# Patient Record
Sex: Female | Born: 1950 | Race: White | Hispanic: No | Marital: Married | Smoking: Never smoker
Health system: Southern US, Community
[De-identification: ages and names within clinical notes are randomized; demographics above are authoritative.]

## PROBLEM LIST (undated history)

## (undated) DIAGNOSIS — J45909 Unspecified asthma, uncomplicated: Secondary | ICD-10-CM

## (undated) DIAGNOSIS — J302 Other seasonal allergic rhinitis: Secondary | ICD-10-CM

## (undated) DIAGNOSIS — T7840XA Allergy, unspecified, initial encounter: Secondary | ICD-10-CM

## (undated) HISTORY — DX: Unspecified asthma, uncomplicated: J45.909

## (undated) HISTORY — DX: Other seasonal allergic rhinitis: J30.2

## (undated) HISTORY — DX: Allergy, unspecified, initial encounter: T78.40XA

---

## 1997-07-15 ENCOUNTER — Emergency Department (HOSPITAL_COMMUNITY): Admission: RE | Admit: 1997-07-15 | Discharge: 1997-07-15 | Payer: Self-pay | Admitting: Emergency Medicine

## 1998-08-03 ENCOUNTER — Other Ambulatory Visit: Admission: RE | Admit: 1998-08-03 | Discharge: 1998-08-03 | Payer: Self-pay | Admitting: *Deleted

## 1998-10-16 ENCOUNTER — Ambulatory Visit (HOSPITAL_COMMUNITY): Admission: RE | Admit: 1998-10-16 | Discharge: 1998-10-16 | Payer: Self-pay | Admitting: *Deleted

## 1998-10-16 ENCOUNTER — Encounter: Payer: Self-pay | Admitting: *Deleted

## 1999-10-11 ENCOUNTER — Encounter: Admission: RE | Admit: 1999-10-11 | Discharge: 1999-10-11 | Payer: Self-pay | Admitting: *Deleted

## 1999-10-11 ENCOUNTER — Encounter: Payer: Self-pay | Admitting: *Deleted

## 1999-10-11 ENCOUNTER — Other Ambulatory Visit: Admission: RE | Admit: 1999-10-11 | Discharge: 1999-10-11 | Payer: Self-pay | Admitting: *Deleted

## 2000-03-20 ENCOUNTER — Ambulatory Visit (HOSPITAL_COMMUNITY): Admission: RE | Admit: 2000-03-20 | Discharge: 2000-03-20 | Payer: Self-pay | Admitting: *Deleted

## 2000-03-20 ENCOUNTER — Encounter: Payer: Self-pay | Admitting: *Deleted

## 2001-06-18 ENCOUNTER — Ambulatory Visit (HOSPITAL_COMMUNITY): Admission: RE | Admit: 2001-06-18 | Discharge: 2001-06-18 | Payer: Self-pay | Admitting: *Deleted

## 2001-06-18 ENCOUNTER — Encounter: Payer: Self-pay | Admitting: *Deleted

## 2002-06-03 ENCOUNTER — Encounter: Payer: Self-pay | Admitting: Orthopedic Surgery

## 2002-06-03 ENCOUNTER — Encounter: Admission: RE | Admit: 2002-06-03 | Discharge: 2002-06-03 | Payer: Self-pay | Admitting: Orthopedic Surgery

## 2002-07-22 ENCOUNTER — Encounter: Payer: Self-pay | Admitting: Obstetrics and Gynecology

## 2002-07-22 ENCOUNTER — Ambulatory Visit (HOSPITAL_COMMUNITY): Admission: RE | Admit: 2002-07-22 | Discharge: 2002-07-22 | Payer: Self-pay | Admitting: Obstetrics and Gynecology

## 2002-09-02 ENCOUNTER — Other Ambulatory Visit: Admission: RE | Admit: 2002-09-02 | Discharge: 2002-09-02 | Payer: Self-pay | Admitting: Obstetrics and Gynecology

## 2003-10-20 ENCOUNTER — Ambulatory Visit (HOSPITAL_COMMUNITY): Admission: RE | Admit: 2003-10-20 | Discharge: 2003-10-20 | Payer: Self-pay | Admitting: Obstetrics and Gynecology

## 2003-11-22 ENCOUNTER — Other Ambulatory Visit: Admission: RE | Admit: 2003-11-22 | Discharge: 2003-11-22 | Payer: Self-pay | Admitting: Obstetrics and Gynecology

## 2004-10-18 ENCOUNTER — Other Ambulatory Visit: Admission: RE | Admit: 2004-10-18 | Discharge: 2004-10-18 | Payer: Self-pay | Admitting: Obstetrics and Gynecology

## 2004-10-25 ENCOUNTER — Ambulatory Visit (HOSPITAL_COMMUNITY): Admission: RE | Admit: 2004-10-25 | Discharge: 2004-10-25 | Payer: Self-pay | Admitting: Obstetrics and Gynecology

## 2005-11-14 ENCOUNTER — Other Ambulatory Visit: Admission: RE | Admit: 2005-11-14 | Discharge: 2005-11-14 | Payer: Self-pay | Admitting: Obstetrics and Gynecology

## 2005-11-14 ENCOUNTER — Ambulatory Visit (HOSPITAL_COMMUNITY): Admission: RE | Admit: 2005-11-14 | Discharge: 2005-11-14 | Payer: Self-pay | Admitting: Obstetrics and Gynecology

## 2006-11-27 ENCOUNTER — Ambulatory Visit (HOSPITAL_COMMUNITY): Admission: RE | Admit: 2006-11-27 | Discharge: 2006-11-27 | Payer: Self-pay | Admitting: Obstetrics and Gynecology

## 2007-09-03 ENCOUNTER — Ambulatory Visit: Payer: Self-pay | Admitting: Internal Medicine

## 2007-10-09 ENCOUNTER — Encounter: Admission: RE | Admit: 2007-10-09 | Discharge: 2007-10-09 | Payer: Self-pay | Admitting: Neurosurgery

## 2007-12-24 ENCOUNTER — Ambulatory Visit (HOSPITAL_COMMUNITY): Admission: RE | Admit: 2007-12-24 | Discharge: 2007-12-24 | Payer: Self-pay | Admitting: Obstetrics and Gynecology

## 2008-04-20 ENCOUNTER — Telehealth: Payer: Self-pay | Admitting: Internal Medicine

## 2008-05-17 ENCOUNTER — Telehealth: Payer: Self-pay | Admitting: Internal Medicine

## 2008-05-25 IMAGING — MG MM DIGITAL SCREENING BILAT
4 series · 4 of 4 positions shown · non-contrast
Comparison: none

DG SCREEN MAMMOGRAM BILATERAL
Bilateral CC and MLO view(s) were taken.

SCREENING MAMMOGRAM:
There is a  dense fibroglandular pattern.  No masses or malignant type calcifications are 
identified.  Compared with prior studies.

[R CC]
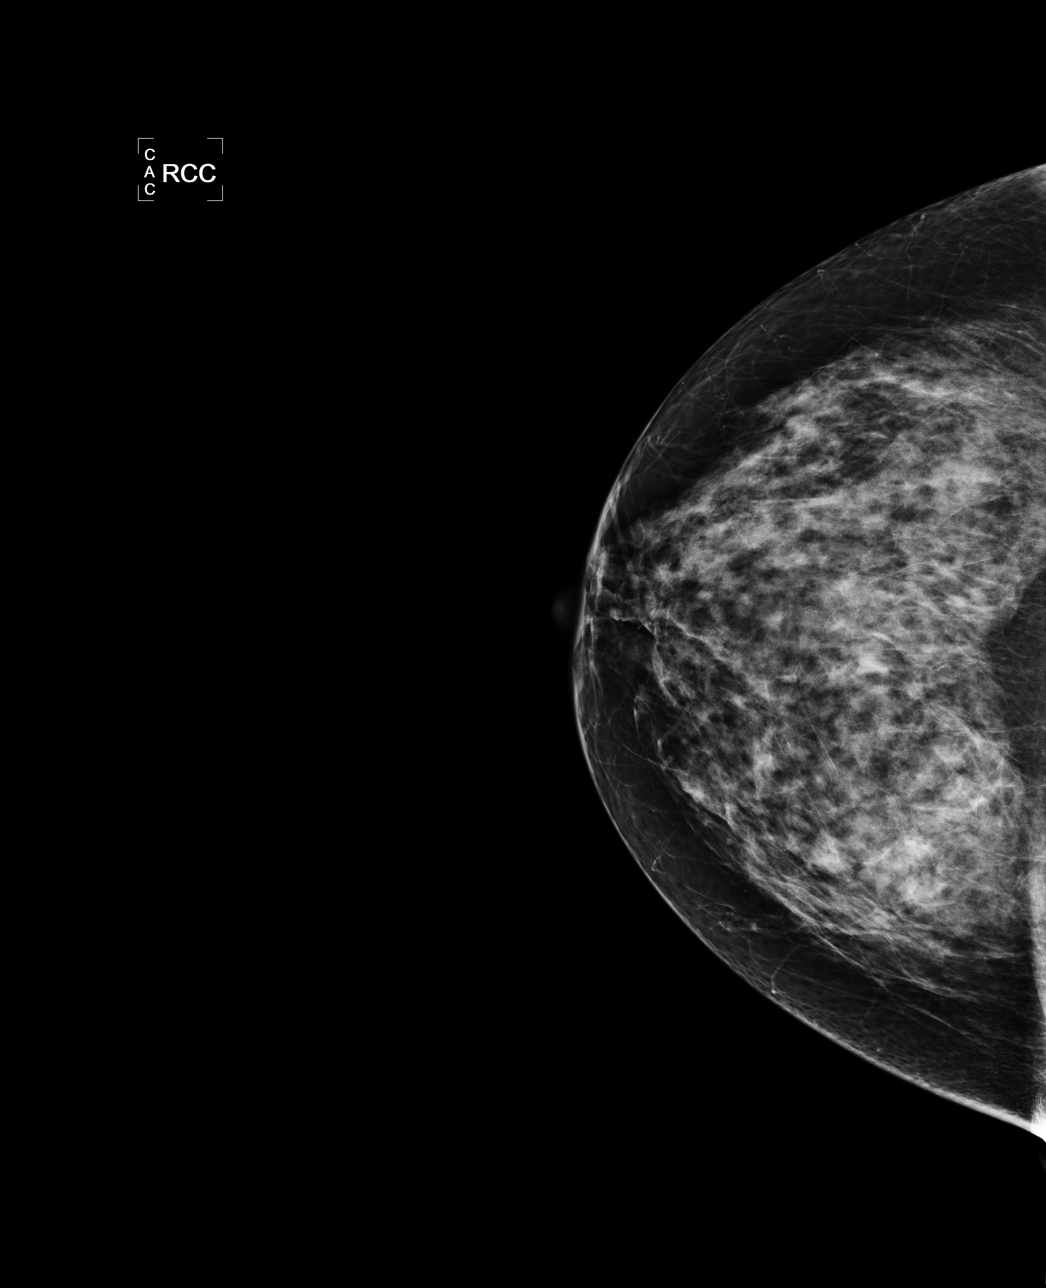

[R MLO]
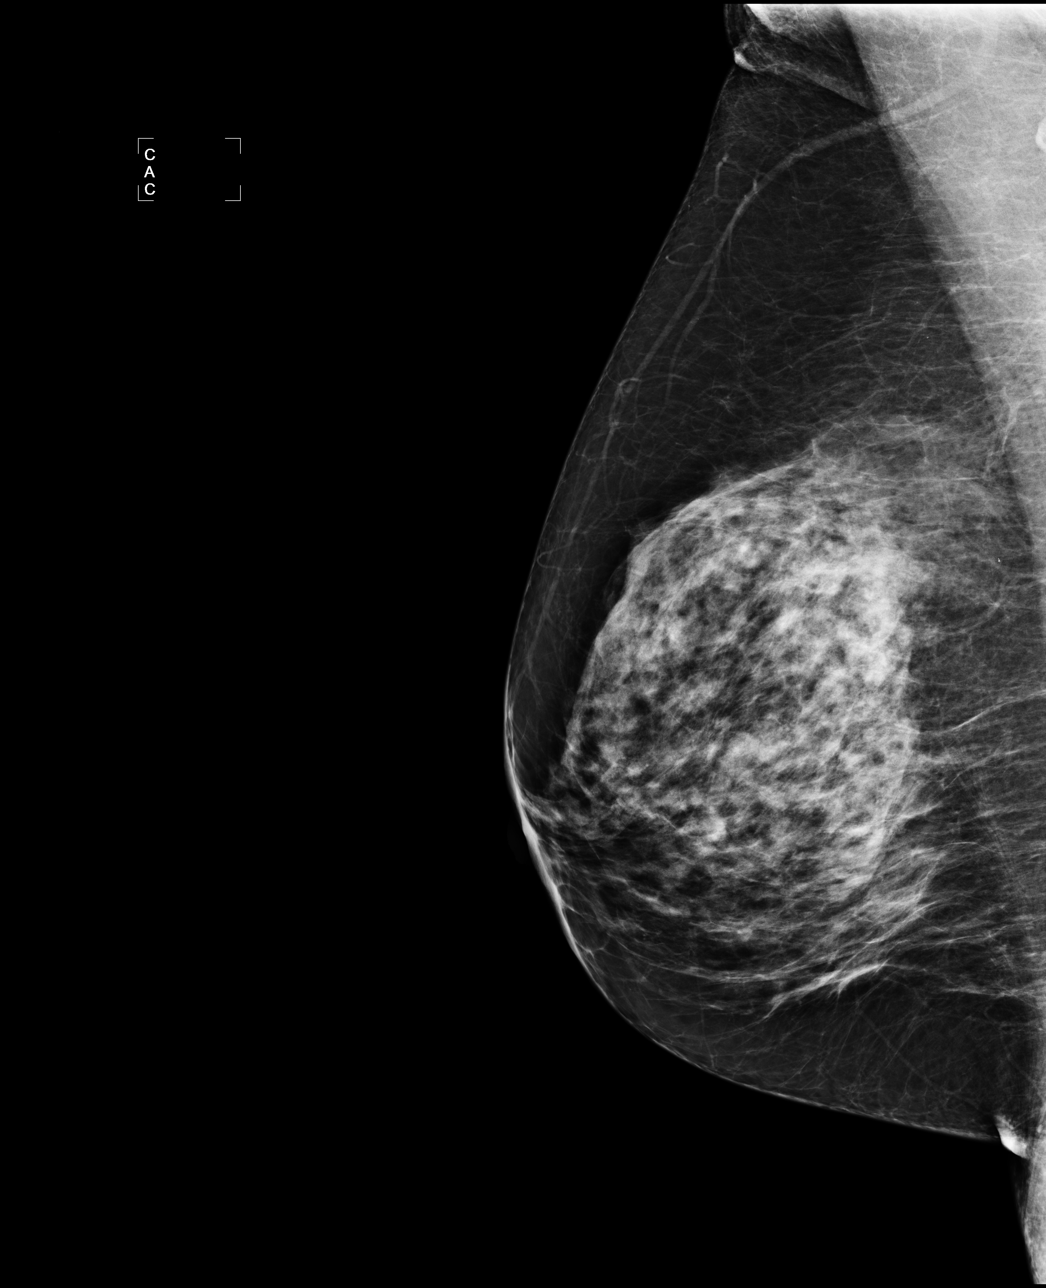

[L CC]
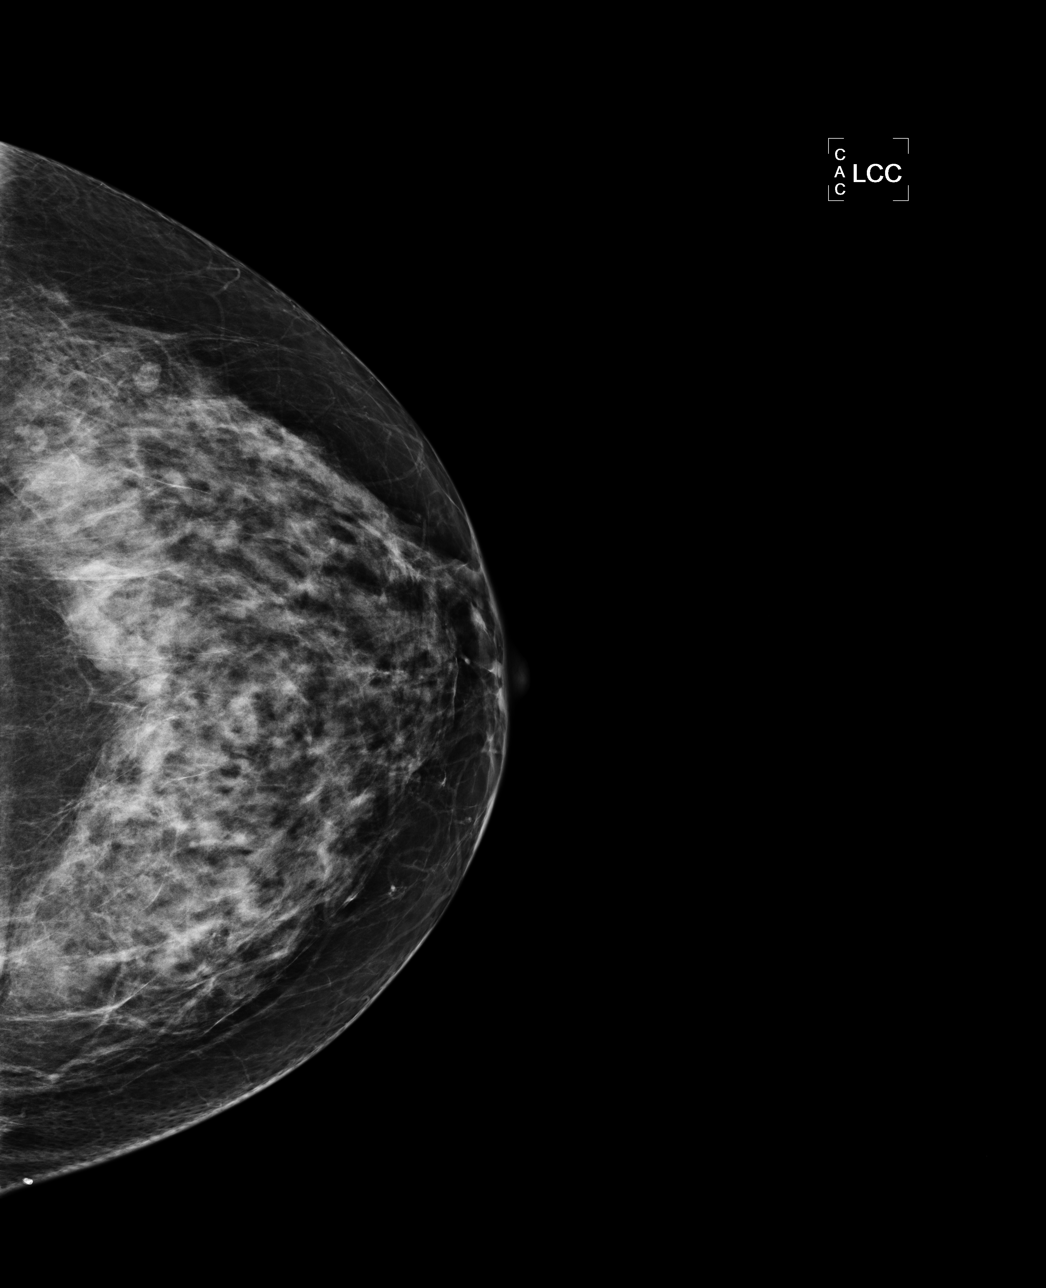

[L MLO]
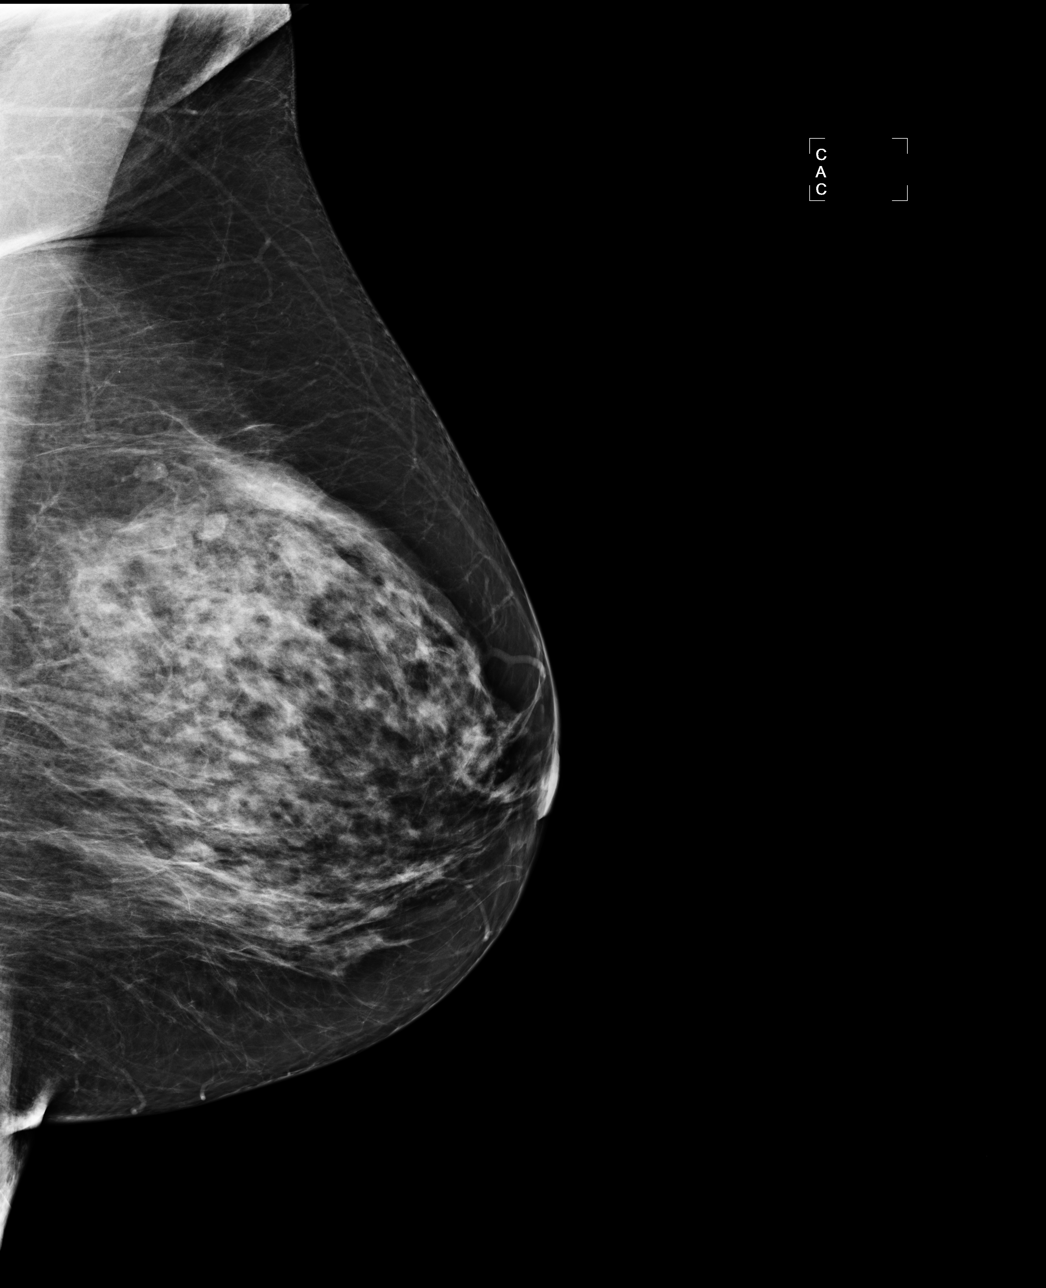

[4 of 4 positions shown; findings below may reference images not displayed]

IMPRESSION: No specific mammographic evidence of malignancy.  Next screening mammogram is recommended in one 
year.

ASSESSMENT: Negative - BI-RADS 1

Screening mammogram in 1 year.
ANALYZED BY COMPUTER AIDED DETECTION. , THIS PROCEDURE WAS A DIGITAL MAMMOGRAM.

## 2008-05-26 ENCOUNTER — Emergency Department (HOSPITAL_COMMUNITY): Admission: EM | Admit: 2008-05-26 | Discharge: 2008-05-26 | Payer: Self-pay | Admitting: Family Medicine

## 2008-08-28 DIAGNOSIS — M949 Disorder of cartilage, unspecified: Secondary | ICD-10-CM

## 2008-08-28 DIAGNOSIS — M899 Disorder of bone, unspecified: Secondary | ICD-10-CM | POA: Insufficient documentation

## 2008-08-29 ENCOUNTER — Ambulatory Visit: Payer: Self-pay | Admitting: Internal Medicine

## 2008-10-27 ENCOUNTER — Ambulatory Visit: Payer: Self-pay | Admitting: Internal Medicine

## 2008-10-27 ENCOUNTER — Encounter: Payer: Self-pay | Admitting: Internal Medicine

## 2008-10-30 ENCOUNTER — Encounter: Payer: Self-pay | Admitting: Internal Medicine

## 2009-01-12 ENCOUNTER — Encounter: Admission: RE | Admit: 2009-01-12 | Discharge: 2009-01-12 | Payer: Self-pay | Admitting: Obstetrics and Gynecology

## 2010-01-25 ENCOUNTER — Encounter: Admission: RE | Admit: 2010-01-25 | Discharge: 2010-01-25 | Payer: Self-pay | Admitting: Obstetrics and Gynecology

## 2010-06-09 ENCOUNTER — Encounter: Payer: Self-pay | Admitting: Obstetrics and Gynecology

## 2011-03-05 ENCOUNTER — Other Ambulatory Visit: Payer: Self-pay | Admitting: Obstetrics and Gynecology

## 2011-03-05 DIAGNOSIS — Z1231 Encounter for screening mammogram for malignant neoplasm of breast: Secondary | ICD-10-CM

## 2011-03-14 ENCOUNTER — Ambulatory Visit
Admission: RE | Admit: 2011-03-14 | Discharge: 2011-03-14 | Disposition: A | Discharge status: Home / Self Care | Payer: BC Managed Care – PPO | Source: Ambulatory Visit | Attending: Obstetrics and Gynecology | Admitting: Obstetrics and Gynecology

## 2011-03-14 DIAGNOSIS — Z1231 Encounter for screening mammogram for malignant neoplasm of breast: Secondary | ICD-10-CM

## 2012-02-27 ENCOUNTER — Other Ambulatory Visit: Payer: Self-pay | Admitting: Obstetrics and Gynecology

## 2012-02-27 DIAGNOSIS — Z1231 Encounter for screening mammogram for malignant neoplasm of breast: Secondary | ICD-10-CM

## 2012-04-02 ENCOUNTER — Ambulatory Visit
Admission: RE | Admit: 2012-04-02 | Discharge: 2012-04-02 | Disposition: A | Discharge status: Home / Self Care | Payer: BC Managed Care – PPO | Source: Ambulatory Visit | Attending: Obstetrics and Gynecology | Admitting: Obstetrics and Gynecology

## 2012-04-02 DIAGNOSIS — Z1231 Encounter for screening mammogram for malignant neoplasm of breast: Secondary | ICD-10-CM

## 2013-03-11 ENCOUNTER — Other Ambulatory Visit: Payer: Self-pay

## 2013-03-11 DIAGNOSIS — Z1231 Encounter for screening mammogram for malignant neoplasm of breast: Secondary | ICD-10-CM

## 2013-07-01 ENCOUNTER — Ambulatory Visit: Payer: BC Managed Care – PPO

## 2013-09-07 ENCOUNTER — Encounter: Payer: Self-pay | Admitting: General Practice

## 2013-09-07 ENCOUNTER — Encounter: Payer: Self-pay | Admitting: Internal Medicine

## 2014-02-14 ENCOUNTER — Encounter: Payer: Self-pay | Admitting: Internal Medicine

## 2014-08-31 ENCOUNTER — Encounter: Payer: Self-pay | Admitting: Internal Medicine

## 2015-01-29 ENCOUNTER — Emergency Department (HOSPITAL_COMMUNITY)
Admission: EM | Admit: 2015-01-29 | Discharge: 2015-01-29 | Disposition: A | Discharge status: Home / Self Care | Payer: BC Managed Care – PPO | Attending: Emergency Medicine | Admitting: Emergency Medicine

## 2015-01-29 ENCOUNTER — Encounter (HOSPITAL_COMMUNITY): Payer: Self-pay | Admitting: *Deleted

## 2015-01-29 DIAGNOSIS — R42 Dizziness and giddiness: Secondary | ICD-10-CM | POA: Diagnosis not present

## 2015-01-29 MED ORDER — MECLIZINE HCL 25 MG PO TABS
25.0000 mg | ORAL_TABLET | Freq: Once | ORAL | Status: AC
  Administered 2015-01-29: 25 mg via ORAL
  Filled 2015-01-29: qty 1

## 2015-01-29 MED ORDER — LORAZEPAM 0.5 MG PO TABS
0.5000 mg | ORAL_TABLET | Freq: Once | ORAL | Status: AC
  Administered 2015-01-29: 0.5 mg via ORAL
  Filled 2015-01-29: qty 1

## 2015-01-29 MED ORDER — LORAZEPAM 1 MG PO TABS: 0.5000 mg | ORAL_TABLET | Freq: Three times a day (TID) | ORAL | Status: DC | PRN

## 2015-01-29 MED ORDER — MECLIZINE HCL 25 MG PO TABS: 25.0000 mg | ORAL_TABLET | Freq: Three times a day (TID) | ORAL | Status: DC | PRN

## 2015-01-29 NOTE — ED Notes (Signed)
Bed: EC95 Expected date: 01/29/15 Expected time:  Means of arrival: Ambulance Comments: Weakness

## 2015-01-29 NOTE — ED Notes (Signed)
Per the patient she woke up this AM 0430 to ambulate to restroom, experienced dizziness, nausea. No emesis. Experienced 2 episodes of watery diarrhea since.

## 2015-01-29 NOTE — Discharge Instructions (Signed)
Benign Positional Vertigo Vertigo means you feel like you or your surroundings are moving when they are not. Benign positional vertigo is the most common form of vertigo. Benign means that the cause of your condition is not serious. Benign positional vertigo is more common in older adults. CAUSES  Benign positional vertigo is the result of an upset in the labyrinth system. This is an area in the middle ear that helps control your balance. This may be caused by a viral infection, head injury, or repetitive motion. However, often no specific cause is found. SYMPTOMS  Symptoms of benign positional vertigo occur when you move your head or eyes in different directions. Some of the symptoms may include:  Loss of balance and falls.  Vomiting.  Blurred vision.  Dizziness.  Nausea.  Involuntary eye movements (nystagmus). DIAGNOSIS  Benign positional vertigo is usually diagnosed by physical exam. If the specific cause of your benign positional vertigo is unknown, your caregiver may perform imaging tests, such as magnetic resonance imaging (MRI) or computed tomography (CT). TREATMENT  Your caregiver may recommend movements or procedures to correct the benign positional vertigo. Medicines such as meclizine, benzodiazepines, and medicines for nausea may be used to treat your symptoms. In rare cases, if your symptoms are caused by certain conditions that affect the inner ear, you may need surgery. HOME CARE INSTRUCTIONS   Follow your caregiver's instructions.  Move slowly. Do not make sudden body or head movements.  Avoid driving.  Avoid operating heavy machinery.  Avoid performing any tasks that would be dangerous to you or others during a vertigo episode.  Drink enough fluids to keep your urine clear or pale yellow. SEEK IMMEDIATE MEDICAL CARE IF:   You develop problems with walking, weakness, numbness, or using your arms, hands, or legs.  You have difficulty speaking.  You develop  severe headaches.  Your nausea or vomiting continues or gets worse.  You develop visual changes.  Your family or friends notice any behavioral changes.  Your condition gets worse.  You have a fever.  You develop a stiff neck or sensitivity to light. MAKE SURE YOU:   Understand these instructions.  Will watch your condition.  Will get help right away if you are not doing well or get worse. Document Released: 02/10/2006 Document Revised: 07/28/2011 Document Reviewed: 01/23/2011 [facility] Patient Information 2015 [place], [place]. This information is not intended to replace advice given to you by your health care provider. Make sure you discuss any questions you have with your health care provider.   Medication for dizziness and relaxation. Rest. Follow-up your primary care doctor.

## 2015-01-29 NOTE — ED Provider Notes (Signed)
CSN: [id number]     Arrival date & time 01/29/15  0911 History   First MD Initiated Contact with Patient 01/29/15 [phone]     Chief Complaint  Patient presents with  . Dizziness    With nausea, diarrhea     (Consider location/radiation/quality/duration/timing/severity/associated sxs/prior Treatment) HPI.... Dizziness since 0430 today described as room spinning. This is never happened before. She was able to ambulate. No gross neurological deficits. No facial drooping. No confusion. No stiff neck. She is normally healthy. Takes no medications. No alcohol or drugs. She took nothing for her symptoms at home  No past medical history on file. No past surgical history on file. Family History  Problem Relation Age of Onset  . Diabetes Father   . Stroke Father    Social History  Substance Use Topics  . Smoking status: Never Smoker   . Smokeless tobacco: Not on file  . Alcohol Use: No   OB History    No data available     Review of Systems  All other systems reviewed and are negative.     Allergies  Review of patient's allergies indicates not on file.  Home Medications   Prior to Admission medications   Medication Sig Start Date End Date Taking? Authorizing Provider  LORazepam (ATIVAN) 1 MG tablet Take 0.5 tablets (0.5 mg total) by mouth 3 (three) times daily as needed for anxiety. 01/29/15   Nat Christen, MD  meclizine (ANTIVERT) 25 MG tablet Take 1 tablet (25 mg total) by mouth 3 (three) times daily as needed for dizziness or nausea. 01/29/15   Nat Christen, MD   BP 117/56 mmHg  Pulse 65  Temp(Src) 98.2 F (36.8 C) (Oral)  Resp 16  Ht 5\' 6"  (1.676 m)  Wt 132 lb (59.875 kg)  BMI 21.32 kg/m2  SpO2 98% Physical Exam  Constitutional: She is oriented to person, place, and time. She appears well-developed and well-nourished.  HENT:  Head: Normocephalic and atraumatic.  Eyes: Conjunctivae and EOM are normal. Pupils are equal, round, and reactive to light.  Neck: Normal range of  motion. Neck supple.  Cardiovascular: Normal rate and regular rhythm.   Pulmonary/Chest: Effort normal and breath sounds normal.  Abdominal: Soft. Bowel sounds are normal.  Musculoskeletal: Normal range of motion.  Neurological: She is alert and oriented to person, place, and time.  Slight horizontal nystagmus  Skin: Skin is warm and dry.  Psychiatric: She has a normal mood and affect. Her behavior is normal.  Nursing note and vitals reviewed.   ED Course  Procedures (including critical care time) Labs Review Labs Reviewed - No data to display  Imaging Review No results found. I have personally reviewed and evaluated these images and lab results as part of my medical decision-making.   EKG Interpretation None      MDM   Final diagnoses:  Vertigo    History of physical consistent with benign positional vertigo. No evidence of stroke. Rx Ativan 0.5 mg and Antivert 25 mg. Discussed with patient and her family.    Nat Christen, MD 01/29/15 1014

## 2015-07-03 ENCOUNTER — Encounter: Payer: Self-pay | Admitting: Internal Medicine

## 2015-09-11 ENCOUNTER — Ambulatory Visit (INDEPENDENT_AMBULATORY_CARE_PROVIDER_SITE_OTHER)
Admission: EM | Admit: 2015-09-11 | Discharge: 2015-09-11 | Disposition: A | Discharge status: Home / Self Care | Payer: BC Managed Care – PPO | Source: Home / Self Care | Attending: Emergency Medicine | Admitting: Emergency Medicine

## 2015-09-11 ENCOUNTER — Encounter (HOSPITAL_COMMUNITY): Payer: Self-pay | Admitting: Emergency Medicine

## 2015-09-11 DIAGNOSIS — L02411 Cutaneous abscess of right axilla: Secondary | ICD-10-CM | POA: Insufficient documentation

## 2015-09-11 DIAGNOSIS — L03111 Cellulitis of right axilla: Secondary | ICD-10-CM | POA: Insufficient documentation

## 2015-09-11 MED ORDER — SULFAMETHOXAZOLE-TRIMETHOPRIM 800-160 MG PO TABS: 1.0000 | ORAL_TABLET | Freq: Two times a day (BID) | ORAL | Status: AC

## 2015-09-11 MED ORDER — CEPHALEXIN 500 MG PO CAPS: 500.0000 mg | ORAL_CAPSULE | Freq: Four times a day (QID) | ORAL | Status: DC

## 2015-09-11 MED ORDER — LIDOCAINE HCL 2 % IJ SOLN
INTRAMUSCULAR | Status: AC
  Filled 2015-09-11: qty 20

## 2015-09-11 MED ORDER — LIDOCAINE HCL (PF) 1 % IJ SOLN
INTRAMUSCULAR | Status: AC
  Filled 2015-09-11: qty 5

## 2015-09-11 MED ORDER — CEFTRIAXONE SODIUM 1 G IJ SOLR
1.0000 g | Freq: Once | INTRAMUSCULAR | Status: AC
  Administered 2015-09-11: 1 g via INTRAMUSCULAR

## 2015-09-11 MED ORDER — HYDROCODONE-ACETAMINOPHEN 5-325 MG PO TABS: 1.0000 | ORAL_TABLET | ORAL | Status: DC | PRN

## 2015-09-11 MED ORDER — CEFTRIAXONE SODIUM 1 G IJ SOLR
INTRAMUSCULAR | Status: AC
  Filled 2015-09-11: qty 10

## 2015-09-11 NOTE — ED Provider Notes (Signed)
CSN: [id number]     Arrival date & time 09/11/15  1411 History   First MD Initiated Contact with Patient 09/11/15 1422     Chief Complaint  Patient presents with  . Abscess   (Consider location/radiation/quality/duration/timing/severity/associated sxs/prior Treatment) HPI  She is a 65 year old woman here for evaluation of abscess. She states she noticed it in her right axilla 3 days ago. It has gradually been getting worse and more painful. She does report some spontaneous drainage. She also reports chills and subjective fever. No nausea or vomiting. She has been doing warm compresses. She did have an abscess in the left axilla a year ago.  History reviewed. No pertinent past medical history. History reviewed. No pertinent past surgical history. Family History  Problem Relation Age of Onset  . Diabetes Father   . Stroke Father    Social History  Substance Use Topics  . Smoking status: Never Smoker   . Smokeless tobacco: None  . Alcohol Use: No   OB History    No data available     Review of Systems As in history of present illness Allergies  Review of patient's allergies indicates no known allergies.  Home Medications   Prior to Admission medications   Medication Sig Start Date End Date Taking? Authorizing Provider  fluticasone (FLONASE) 50 MCG/ACT nasal spray Place into both nostrils daily.   Yes Historical Provider, MD  cephALEXin (KEFLEX) 500 MG capsule Take 1 capsule (500 mg total) by mouth 4 (four) times daily. 09/11/15   Melony Overly, MD  HYDROcodone-acetaminophen (NORCO) 5-325 MG tablet Take 1 tablet by mouth every 4 (four) hours as needed for moderate pain. 09/11/15   Melony Overly, MD  LORazepam (ATIVAN) 1 MG tablet Take 0.5 tablets (0.5 mg total) by mouth 3 (three) times daily as needed for anxiety. 01/29/15   Nat Christen, MD  meclizine (ANTIVERT) 25 MG tablet Take 1 tablet (25 mg total) by mouth 3 (three) times daily as needed for dizziness or nausea. 01/29/15   Nat Christen, MD  sulfamethoxazole-trimethoprim (BACTRIM DS,SEPTRA DS) 800-160 MG tablet Take 1 tablet by mouth 2 (two) times daily. 09/11/15 09/18/15  Melony Overly, MD   Meds Ordered and Administered this Visit   Medications  cefTRIAXone (ROCEPHIN) injection 1 g (1 g Intramuscular Given 09/11/15 1512)    BP 112/53 mmHg  Pulse 88  Temp(Src) 98.6 F (37 C) (Oral)  Resp 18  SpO2 100% No data found.   Physical Exam  Constitutional: She is oriented to person, place, and time. She appears well-developed and well-nourished. No distress.  Cardiovascular: Normal rate.   Pulmonary/Chest: Effort normal.  Neurological: She is alert and oriented to person, place, and time.  Skin:  She has a 1 cm abscess in the right axilla that has spontaneously drained. More concerning, she has a large area of erythema and induration inferior to this. This area is quite tender and warm.    ED Course  .Marland KitchenIncision and Drainage Date/Time: 09/11/2015 3:15 PM Performed by: Melony Overly Authorized by: Melony Overly Consent: Verbal consent obtained. Risks and benefits: risks, benefits and alternatives were discussed Consent given by: patient Patient understanding: patient states understanding of the procedure being performed Patient identity confirmed: verbally with patient Time out: Immediately prior to procedure a "time out" was called to verify the correct patient, procedure, equipment, support staff and site/side marked as required. Type: abscess Location: right axilla. Anesthesia: local infiltration Local anesthetic: lidocaine 2% without epinephrine Anesthetic total:  1 ml Scalpel size: 11 Incision type: single straight Incision depth: dermal Complexity: simple Drainage: purulent Drainage amount: moderate Wound treatment: wound left open Patient tolerance: Patient tolerated the procedure well with no immediate complications   (including critical care time)  Labs Review Labs Reviewed  CULTURE,  ROUTINE-ABSCESS    Imaging Review No results found.   MDM   1. Abscess of right axilla   2. Cellulitis of right axilla    Culture sent. Will start on Keflex and Bactrim. Dose of Rocephin given here. Prescription given for hydrocodone to use as needed for pain. Follow-up if not improving in 2 days.    Melony Overly, MD 09/11/15 [phone]

## 2015-09-11 NOTE — ED Notes (Signed)
Reports abscess right axilla.  Noticed this on Saturday.  Also concerned for chills and feeling feverish.  Reports having one other episode involving the left axilla.

## 2015-09-11 NOTE — Discharge Instructions (Signed)
You do have a small abscess in your axilla. Most of your pain is coming from a surrounding cellulitis. Take Keflex 4 times a day for the next week. Take Bactrim twice a day for the next week. Continue ibuprofen every 6 hours as needed for pain. Use the hydrocodone every 4-6 hours as needed for severe pain. This medicine may make you drowsy or groggy. This should start to improve in 24-48 hours. I did send a culture. We will call if we need to adjust your antibiotics. If it is not getting better in 2 days, please come back.

## 2015-09-12 ENCOUNTER — Observation Stay (HOSPITAL_COMMUNITY): Payer: BC Managed Care – PPO | Admitting: Anesthesiology

## 2015-09-12 ENCOUNTER — Inpatient Hospital Stay (HOSPITAL_COMMUNITY)
Admission: AD | Admit: 2015-09-12 | Discharge: 2015-09-15 | DRG: 603 | Disposition: A | Discharge status: Home / Self Care | Payer: BC Managed Care – PPO | Source: Ambulatory Visit | Attending: General Surgery | Admitting: General Surgery

## 2015-09-12 ENCOUNTER — Other Ambulatory Visit (INDEPENDENT_AMBULATORY_CARE_PROVIDER_SITE_OTHER): Payer: Self-pay | Admitting: Physician Assistant

## 2015-09-12 ENCOUNTER — Encounter (HOSPITAL_COMMUNITY): Admission: AD | Disposition: A | Discharge status: Home / Self Care | Payer: Self-pay | Source: Ambulatory Visit

## 2015-09-12 ENCOUNTER — Encounter (HOSPITAL_COMMUNITY): Payer: Self-pay | Admitting: Certified Registered Nurse Anesthetist

## 2015-09-12 DIAGNOSIS — L03111 Cellulitis of right axilla: Secondary | ICD-10-CM | POA: Diagnosis present

## 2015-09-12 DIAGNOSIS — Z79899 Other long term (current) drug therapy: Secondary | ICD-10-CM

## 2015-09-12 DIAGNOSIS — E876 Hypokalemia: Secondary | ICD-10-CM | POA: Diagnosis present

## 2015-09-12 DIAGNOSIS — Z79891 Long term (current) use of opiate analgesic: Secondary | ICD-10-CM

## 2015-09-12 DIAGNOSIS — L02411 Cutaneous abscess of right axilla: Principal | ICD-10-CM | POA: Diagnosis present

## 2015-09-12 HISTORY — PX: INCISION AND DRAINAGE ABSCESS: SHX5864

## 2015-09-12 LAB — COMPREHENSIVE METABOLIC PANEL
ALBUMIN: 3.2 g/dL — AB (ref 3.5–5.0)
ALK PHOS: 83 U/L (ref 38–126)
ALT: 21 U/L (ref 14–54)
AST: 17 U/L (ref 15–41)
Anion gap: 14 (ref 5–15)
BUN: 21 mg/dL — ABNORMAL HIGH (ref 6–20)
CALCIUM: 8.9 mg/dL (ref 8.9–10.3)
CHLORIDE: 99 mmol/L — AB (ref 101–111)
CO2: 24 mmol/L (ref 22–32)
CREATININE: 0.77 mg/dL (ref 0.44–1.00)
GFR calc Af Amer: >60 mL/min (ref >60)
GFR calc non Af Amer: >60 mL/min (ref >60)
GLUCOSE: 114 mg/dL — AB (ref 65–99)
Potassium: 3.8 mmol/L (ref 3.5–5.1)
SODIUM: 137 mmol/L (ref 135–145)
Total Bilirubin: 1 mg/dL (ref 0.3–1.2)
Total Protein: 6.7 g/dL (ref 6.5–8.1)

## 2015-09-12 LAB — CBC WITH DIFFERENTIAL/PLATELET
BASOS PCT: 0 %
Basophils Absolute: 0.1 10*3/uL (ref 0.0–0.1)
EOS ABS: 0.2 10*3/uL (ref 0.0–0.7)
Eosinophils Relative: 1 %
HCT: 38 % (ref 36.0–46.0)
HEMOGLOBIN: 12.6 g/dL (ref 12.0–15.0)
LYMPHS ABS: 1.6 10*3/uL (ref 0.7–4.0)
Lymphocytes Relative: 9 %
MCH: 28.7 pg (ref 26.0–34.0)
MCHC: 33.2 g/dL (ref 30.0–36.0)
MCV: 86.6 fL (ref 78.0–100.0)
Monocytes Absolute: 1.5 10*3/uL — ABNORMAL HIGH (ref 0.1–1.0)
Monocytes Relative: 8 %
NEUTROS PCT: 82 %
Neutro Abs: 14.4 10*3/uL — ABNORMAL HIGH (ref 1.7–7.7)
Platelets: 268 10*3/uL (ref 150–400)
RBC: 4.39 MIL/uL (ref 3.87–5.11)
RDW: 13.5 % (ref 11.5–15.5)
WBC: 17.6 10*3/uL — AB (ref 4.0–10.5)

## 2015-09-12 LAB — SURGICAL PCR SCREEN
MRSA, PCR: NEGATIVE
Staphylococcus aureus: NEGATIVE

## 2015-09-12 SURGERY — INCISION AND DRAINAGE, ABSCESS
Anesthesia: General | Laterality: Right

## 2015-09-12 MED ORDER — HYDROMORPHONE HCL 1 MG/ML IJ SOLN
1.0000 mg | INTRAMUSCULAR | Status: DC | PRN
  Administered 2015-09-12 – 2015-09-13 (×4): 1 mg via INTRAVENOUS
  Filled 2015-09-12 (×4): qty 1

## 2015-09-12 MED ORDER — HYDROMORPHONE HCL 1 MG/ML IJ SOLN: 1.0000 mg | INTRAMUSCULAR | Status: DC | PRN

## 2015-09-12 MED ORDER — VANCOMYCIN HCL IN DEXTROSE 750-5 MG/150ML-% IV SOLN
750.0000 mg | Freq: Two times a day (BID) | INTRAVENOUS | Status: DC
  Administered 2015-09-13 – 2015-09-14 (×3): 750 mg via INTRAVENOUS
  Filled 2015-09-12 (×3): qty 150

## 2015-09-12 MED ORDER — HEPARIN SODIUM (PORCINE) 5000 UNIT/ML IJ SOLN
5000.0000 [IU] | Freq: Three times a day (TID) | INTRAMUSCULAR | Status: DC
Start: 2015-09-12 — End: 2015-09-15
  Administered 2015-09-13 – 2015-09-15 (×7): 5000 [IU] via SUBCUTANEOUS
  Filled 2015-09-12 (×11): qty 1

## 2015-09-12 MED ORDER — FENTANYL CITRATE (PF) 100 MCG/2ML IJ SOLN
INTRAMUSCULAR | Status: DC | PRN
  Administered 2015-09-12 (×2): 50 ug via INTRAVENOUS

## 2015-09-12 MED ORDER — ONDANSETRON HCL 4 MG/2ML IJ SOLN
INTRAMUSCULAR | Status: AC
  Filled 2015-09-12: qty 2

## 2015-09-12 MED ORDER — VANCOMYCIN HCL IN DEXTROSE 1-5 GM/200ML-% IV SOLN
1000.0000 mg | Freq: Once | INTRAVENOUS | Status: AC
  Administered 2015-09-12: 1000 mg via INTRAVENOUS
  Filled 2015-09-12: qty 200

## 2015-09-12 MED ORDER — ONDANSETRON HCL 4 MG/2ML IJ SOLN
INTRAMUSCULAR | Status: DC | PRN
  Administered 2015-09-12: 4 mg via INTRAVENOUS

## 2015-09-12 MED ORDER — FENTANYL CITRATE (PF) 250 MCG/5ML IJ SOLN
INTRAMUSCULAR | Status: AC
  Filled 2015-09-12: qty 5

## 2015-09-12 MED ORDER — PROPOFOL 10 MG/ML IV BOLUS
INTRAVENOUS | Status: AC
  Filled 2015-09-12: qty 20

## 2015-09-12 MED ORDER — PIPERACILLIN-TAZOBACTAM 3.375 G IVPB 30 MIN
3.3750 g | Freq: Once | INTRAVENOUS | Status: AC
  Administered 2015-09-12: 3.375 g via INTRAVENOUS
  Filled 2015-09-12: qty 50

## 2015-09-12 MED ORDER — ONDANSETRON HCL 4 MG/2ML IJ SOLN: 4.0000 mg | Freq: Once | INTRAMUSCULAR | Status: DC | PRN

## 2015-09-12 MED ORDER — HYDROCODONE-ACETAMINOPHEN 5-325 MG PO TABS: 1.0000 | ORAL_TABLET | ORAL | Status: DC | PRN

## 2015-09-12 MED ORDER — PIPERACILLIN-TAZOBACTAM 3.375 G IVPB
3.3750 g | Freq: Three times a day (TID) | INTRAVENOUS | Status: DC
  Administered 2015-09-13 – 2015-09-14 (×4): 3.375 g via INTRAVENOUS
  Filled 2015-09-12 (×6): qty 50

## 2015-09-12 MED ORDER — PROPOFOL 10 MG/ML IV BOLUS
INTRAVENOUS | Status: DC | PRN
  Administered 2015-09-12: 150 mg via INTRAVENOUS

## 2015-09-12 MED ORDER — MIDAZOLAM HCL 2 MG/2ML IJ SOLN
INTRAMUSCULAR | Status: AC
  Filled 2015-09-12: qty 2

## 2015-09-12 MED ORDER — FENTANYL CITRATE (PF) 100 MCG/2ML IJ SOLN
INTRAMUSCULAR | Status: AC
  Filled 2015-09-12: qty 2

## 2015-09-12 MED ORDER — MIDAZOLAM HCL 5 MG/5ML IJ SOLN
INTRAMUSCULAR | Status: DC | PRN
  Administered 2015-09-12: 1 mg via INTRAVENOUS

## 2015-09-12 MED ORDER — HYDROGEN PEROXIDE 3 % EX SOLN
CUTANEOUS | Status: AC
  Filled 2015-09-12: qty 473

## 2015-09-12 MED ORDER — LIDOCAINE HCL (CARDIAC) 20 MG/ML IV SOLN
INTRAVENOUS | Status: AC
  Filled 2015-09-12: qty 5

## 2015-09-12 MED ORDER — LACTATED RINGERS IV SOLN
INTRAVENOUS | Status: DC | PRN
  Administered 2015-09-12: 19:00:00 via INTRAVENOUS

## 2015-09-12 MED ORDER — SUCCINYLCHOLINE CHLORIDE 20 MG/ML IJ SOLN
INTRAMUSCULAR | Status: DC | PRN
  Administered 2015-09-12: 100 mg via INTRAVENOUS

## 2015-09-12 MED ORDER — KCL IN DEXTROSE-NACL 20-5-0.45 MEQ/L-%-% IV SOLN
INTRAVENOUS | Status: DC
  Administered 2015-09-12: via INTRAVENOUS
  Filled 2015-09-12 (×3): qty 1000

## 2015-09-12 MED ORDER — EPHEDRINE SULFATE 50 MG/ML IJ SOLN
INTRAMUSCULAR | Status: DC | PRN
  Administered 2015-09-12: 5 mg via INTRAVENOUS

## 2015-09-12 MED ORDER — MEPERIDINE HCL 50 MG/ML IJ SOLN
6.2500 mg | INTRAMUSCULAR | Status: DC | PRN
  Administered 2015-09-12: 6.25 mg via INTRAVENOUS

## 2015-09-12 MED ORDER — FENTANYL CITRATE (PF) 100 MCG/2ML IJ SOLN
25.0000 ug | INTRAMUSCULAR | Status: DC | PRN
  Administered 2015-09-12 (×3): 25 ug via INTRAVENOUS

## 2015-09-12 MED ORDER — MECLIZINE HCL 25 MG PO TABS
25.0000 mg | ORAL_TABLET | Freq: Three times a day (TID) | ORAL | Status: DC | PRN
  Filled 2015-09-12: qty 1

## 2015-09-12 MED ORDER — EPHEDRINE SULFATE 50 MG/ML IJ SOLN
INTRAMUSCULAR | Status: AC
  Filled 2015-09-12: qty 1

## 2015-09-12 MED ORDER — PIPERACILLIN-TAZOBACTAM 3.375 G IVPB
INTRAVENOUS | Status: AC
  Filled 2015-09-12: qty 50

## 2015-09-12 MED ORDER — LORAZEPAM 0.5 MG PO TABS: 0.5000 mg | ORAL_TABLET | Freq: Three times a day (TID) | ORAL | Status: DC | PRN

## 2015-09-12 MED ORDER — LIDOCAINE HCL (CARDIAC) 20 MG/ML IV SOLN
INTRAVENOUS | Status: DC | PRN
  Administered 2015-09-12: 100 mg via INTRAVENOUS

## 2015-09-12 MED ORDER — MEPERIDINE HCL 50 MG/ML IJ SOLN
INTRAMUSCULAR | Status: AC
  Filled 2015-09-12: qty 1

## 2015-09-12 MED ORDER — SODIUM CHLORIDE 0.9 % IJ SOLN
INTRAMUSCULAR | Status: AC
  Filled 2015-09-12: qty 10

## 2015-09-12 SURGICAL SUPPLY — 29 items
BLADE SURG 15 STRL LF DISP TIS (BLADE) ×1 IMPLANT
BLADE SURG 15 STRL SS (BLADE) ×3
BNDG GAUZE ELAST 4 BULKY (GAUZE/BANDAGES/DRESSINGS) IMPLANT
COVER SURGICAL LIGHT HANDLE (MISCELLANEOUS) ×4 IMPLANT
DECANTER SPIKE VIAL GLASS SM (MISCELLANEOUS) IMPLANT
DRAPE LAPAROSCOPIC ABDOMINAL (DRAPES) IMPLANT
DRSG PAD ABDOMINAL 8X10 ST (GAUZE/BANDAGES/DRESSINGS) IMPLANT
ELECT PENCIL ROCKER SW 15FT (MISCELLANEOUS) ×3 IMPLANT
ELECT REM PT RETURN 9FT ADLT (ELECTROSURGICAL) ×3
ELECTRODE REM PT RTRN 9FT ADLT (ELECTROSURGICAL) ×1 IMPLANT
GAUZE SPONGE 4X4 12PLY STRL (GAUZE/BANDAGES/DRESSINGS) IMPLANT
GLOVE BIO SURGEON STRL SZ7.5 (GLOVE) ×7 IMPLANT
GOWN SPEC L4 XLG W/TWL (GOWN DISPOSABLE) ×5 IMPLANT
GOWN STRL REUS W/TWL LRG LVL3 (GOWN DISPOSABLE) ×6 IMPLANT
GOWN STRL REUS W/TWL XL LVL3 (GOWN DISPOSABLE) ×3 IMPLANT
KIT BASIN OR (CUSTOM PROCEDURE TRAY) ×3 IMPLANT
NDL HYPO 25X1 1.5 SAFETY (NEEDLE) IMPLANT
NEEDLE HYPO 25X1 1.5 SAFETY (NEEDLE) IMPLANT
NS IRRIG 1000ML POUR BTL (IV SOLUTION) ×3 IMPLANT
SPONGE LAP 18X18 X RAY DECT (DISPOSABLE) ×1 IMPLANT
SUT MNCRL AB 4-0 PS2 18 (SUTURE) ×2 IMPLANT
SUT VIC AB 3-0 SH 27 (SUTURE)
SUT VIC AB 3-0 SH 27XBRD (SUTURE) IMPLANT
SWAB COLLECTION DEVICE MRSA (MISCELLANEOUS) ×2 IMPLANT
SWAB CULTURE ESWAB REG 1ML (MISCELLANEOUS) IMPLANT
SYR BULB 3OZ (MISCELLANEOUS) ×3 IMPLANT
SYR CONTROL 10ML LL (SYRINGE) IMPLANT
TOWEL OR 17X26 10 PK STRL BLUE (TOWEL DISPOSABLE) ×3 IMPLANT
YANKAUER SUCT BULB TIP NO VENT (SUCTIONS) ×3 IMPLANT

## 2015-09-12 NOTE — H&P (Signed)
  Jenny Terry 09/12/2015 3:01 PM Location: [facility] Patient #: [id number] DOB: 01-Oct-1950 Married / Language: Jenny Terry / Race: White Female  History of Present Illness Jenny Sanger Hitchcock PA C; 09/12/2015 3:31 PM) The patient is a 65 year old female who presents with a subcutaneous abscess. Jenny Terry is a 65 year old patient who comes in today with a six-day history of pain to the right axilla. She has a previous history of left axillary abscess that was drained by Dr. Rosendo Gros in 2016. She states that her current problem has gotten worse over the past few days. She was seen in urgent care who prescribed antibiotics and pain medication. He states that they removed some fluid to send it for culture. Unfortunately, the pain and spreading redness has worsened significantly. She does not recall having a fever however she does report having shaking chills a few days ago. She has been taking Bactrim and Keflex both. She says she was given an injection.  Her records from the urgent care visit was reviewed. She was given Rocephin and a culture was sent. It appears that the area had spontaneously drained.   Allergies Jenny Ewings, [place]; 09/12/2015 3:01 PM) No Known Drug Allergies 07/25/2014  Medication History Jenny Terry; 09/12/2015 3:02 PM) Doxycycline Hyclate (100MG  Tablet, Oral) Active. Hydrocodone/Acetaminophen (Oral) Active. Fluticasone Propionate (50MCG/ACT Suspension, Nasal) Active. Alrex (0.2% Suspension, Ophthalmic) Active. Cephalexin (500MG  Capsule, Oral) Active. Sulfamethoxazole Micro Active. Medications Reconciled    Vitals Jenny Ewings Terry; 09/12/2015 3:02 PM) 09/12/2015 3:02 PM Weight: 144 lb Height: 66in Body Surface Area: 1.74 m Body Mass Index: 23.24 kg/m  Temp.: 97.21F  Pulse: 80 (Regular)  BP: 130/72 (Sitting, Left Arm, Standard)      Physical Exam (Jenny Hakanson PA C; 09/12/2015 3:38 PM)  General Mental  Status-Alert. General Appearance-Cooperative, Not in acute distress. Orientation-Oriented X4.  Integumentary Note: Normal except for breast exam.  Head and Neck Head-normocephalic, atraumatic with no lesions or palpable masses.  Chest and Lung Exam Chest and lung exam reveals -quiet, even and easy respiratory effort with no use of accessory muscles.  Breast Note: There is an area of tenderness and what appears to be spontaneous opening of an abscess to the right axilla. There is an area of erythema spreading inferiorly and anteriorly that extends up to 20 cm from the axilla. It is indurated, warm and exquisitely tender.  Neurologic Neurologic evaluation reveals -normal attention span and ability to concentrate and able to name objects and repeat phrases. Appropriate fund of knowledge .  Musculoskeletal Global Assessment Gait and Station - normal gait and station and normal posture.    Assessment & Plan Jenny Sanger [name] PA C; 09/12/2015 3:44 PM)  CELLULITIS OF AXILLA, RIGHT (L03.111) Impression: The patient was examined with Dr. Rosendo Gros. She needs to be admitted for IV antibiotics and observation. Culture from urgent care reviewed. Gram stain reveals GPC in pairs/clusters. Discussed with Dr. Excell Seltzer at [facility] Will admit for the above while culture is pending. Discussed with patient who voiced understanding.  ABSCESS OF AXILLA, RIGHT (L02.411)  Note: Patient was seen in the office by Dr. Hassell Done who will take her to the operating room for incision and drainage. Discussed with patient and husband.

## 2015-09-12 NOTE — Progress Notes (Addendum)
Pharmacy Antibiotic Note  Jenny Terry is a 65 y.o. female admitted on 09/12/2015 with R axillary abscess. Fluid sent for Cx from Urgent care growing GPCs in pairs/chains.  Failed medical management with Keflex + Bactrim and now pain/erythema has rapidly spread since initial presentation and patient taken to OR for I&D. Pharmacy has been consulted for vancomycin dosing. Zosyn per MD.  Temp: 99.1 WBC: elevated Renal: SCr 0.77; CrCl 68 CG  Plan:  Vancomycin 1000 mg IV now, then 750 mg IV q12 hr; goal trough 15-20 mcg/mL given rapidly progressing infection and good likelihood S aureus.  Measure vancomycin trough levels at steady state as indicated  Zosyn per MD; dosing appropriate    Temp (24hrs), Avg:98.3 F (36.8 C), Min:98.3 F (36.8 C), Max:98.3 F (36.8 C)  No results for input(s): WBC, CREATININE, LATICACIDVEN, VANCOTROUGH, VANCOPEAK, VANCORANDOM, GENTTROUGH, GENTPEAK, GENTRANDOM, TOBRATROUGH, TOBRAPEAK, TOBRARND, AMIKACINPEAK, AMIKACINTROU, AMIKACIN in the last 168 hours.  CrCl cannot be calculated (Unknown ideal weight.).    No Known Allergies  Antimicrobials this admission: vancomycin 4/26 >>  Zosyn 4/26 >>   Dose adjustments this admission: ---  Microbiology results: 4/25 abscess Cx: abundant GPCs in pairs/clusters 4/26 abscess: sent  4/26 MRSA PCR: sent  Thank you for allowing pharmacy to be a part of this patient's care.  Reuel Boom, PharmD, BCPS Pager: [phone] 09/12/2015, 8:10 PM

## 2015-09-12 NOTE — Transfer of Care (Signed)
Immediate Anesthesia Transfer of Care Note  Patient: Jenny Terry  Procedure(s) Performed: Procedure(s): INCISION AND DRAINAGE ABSCESS RIGHT AXILLARY (Right)  Patient Location: PACU  Anesthesia Type:General  Level of Consciousness: awake, alert , oriented and patient cooperative  Airway & Oxygen Therapy: Patient Spontanous Breathing and Patient connected to face mask oxygen  Post-op Assessment: Report given to RN, Post -op Vital signs reviewed and stable and Patient moving all extremities  Post vital signs: Reviewed and stable  Last Vitals:  Filed Vitals:   09/12/15 1804  BP: 125/56  Pulse: 84  Temp: 36.8 C  Resp: 18    Last Pain: There were no vitals filed for this visit.       Complications: No apparent anesthesia complications

## 2015-09-12 NOTE — Op Note (Signed)
Surgeon: Kaylyn Lim, MD, FACS  Asst:  none  Anes:  general  Procedure: Incision, drainage, culture and packing of complex right axillary abscess  Diagnosis: Right axillary abscess x 4 days  Complications: none  EBL:   5 cc  Drains: Penrose drain from top to bottom incisions sewn together outside;  Iodophor packing in middle incision  Description of Procedure:  The patient was taken to OR 4 at [facility] on Wednesday evening.  After anesthesia was administered and the patient was prepped a timeout was performed.  The abscess was high in the axilla but was spreading medially into the breast and posterior to the latissimus dorsi.  Initial aspirate posterior confirmed pus which was sent for culture.  Three transverse incisions were made; the top through the furuncle opening in the top of the axilla, a middle transverse and an inferior incision were all opened and connected.  Digital exploration to all areas that were erythematous.  No gas or crepitus was noticed.  The abscess cavity was irrigated with saline and H2O2 and some iodophor was packed in the middle transverse incision.  Dressings were applied and the patient was taken to the PACU.    The patient tolerated the procedure well and was taken to the PACU in stable condition.     Matt B. Hassell Done, [place], [facility], [place]

## 2015-09-12 NOTE — Anesthesia Procedure Notes (Signed)
Procedure Name: Intubation Date/Time: 09/12/2015 7:11 PM Performed by: Carleene Cooper A Pre-anesthesia Checklist: Patient identified, Emergency Drugs available, Suction available, Patient being monitored and Timeout performed Patient Re-evaluated:Patient Re-evaluated prior to inductionOxygen Delivery Method: Circle system utilized Preoxygenation: Pre-oxygenation with 100% oxygen Intubation Type: IV induction, Cricoid Pressure applied and Rapid sequence Laryngoscope Size: Mac and 4 Grade View: Grade I Tube type: Oral Tube size: 7.5 mm Number of attempts: 1 Airway Equipment and Method: Stylet Placement Confirmation: ETT inserted through vocal cords under direct vision,  breath sounds checked- equal and bilateral and positive ETCO2 Secured at: 22 cm Tube secured with: Tape Dental Injury: Teeth and Oropharynx as per pre-operative assessment  Comments: RSI with cricoid pressure by Dr. Gifford Shave

## 2015-09-12 NOTE — Anesthesia Preprocedure Evaluation (Addendum)
Anesthesia Evaluation  Patient identified by MRN, date of birth, ID band Patient awake    Reviewed: Allergy & Precautions, NPO status , Patient's Chart, lab work & pertinent test results  Airway Mallampati: I  TM Distance: >3 FB Neck ROM: Full    Dental  (+) Teeth Intact, Dental Advisory Given, Caps   Pulmonary neg pulmonary ROS,    Pulmonary exam normal breath sounds clear to auscultation       Cardiovascular Exercise Tolerance: Good negative cardio ROS Normal cardiovascular exam Rhythm:Regular Rate:Normal     Neuro/Psych Benign vertigo negative psych ROS   GI/Hepatic negative GI ROS, Neg liver ROS,   Endo/Other  negative endocrine ROS  Renal/GU negative Renal ROS     Musculoskeletal Osteopenia    Abdominal   Peds  Hematology negative hematology ROS (+)   Anesthesia Other Findings Day of surgery medications reviewed with the patient.  Reproductive/Obstetrics                          Anesthesia Physical Anesthesia Plan  ASA: II and emergent  Anesthesia Plan: General   Post-op Pain Management:    Induction: Intravenous and Rapid sequence  Airway Management Planned: Oral ETT  Additional Equipment:   Intra-op Plan:   Post-operative Plan: Extubation in OR  Informed Consent: I have reviewed the patients History and Physical, chart, labs and discussed the procedure including the risks, benefits and alternatives for the proposed anesthesia with the patient or authorized representative who has indicated his/her understanding and acceptance.   Dental advisory given  Plan Discussed with: CRNA  Anesthesia Plan Comments: (Risks/benefits of general anesthesia discussed with patient including risk of damage to teeth, lips, gum, and tongue, nausea/vomiting, allergic reactions to medications, and the possibility of heart attack, stroke and death.  All patient questions answered.  Patient  wishes to proceed.  Patient ate sandwich at 1300. Will perform RSI and GETA.)      Anesthesia Quick Evaluation

## 2015-09-12 NOTE — Anesthesia Postprocedure Evaluation (Signed)
Anesthesia Post Note  Patient: Jenny Terry  Procedure(s) Performed: Procedure(s) (LRB): INCISION AND DRAINAGE ABSCESS RIGHT AXILLARY (Right)  Patient location during evaluation: PACU Anesthesia Type: General Level of consciousness: awake and alert Pain management: pain level controlled Vital Signs Assessment: post-procedure vital signs reviewed and stable Respiratory status: spontaneous breathing, nonlabored ventilation, respiratory function stable and patient connected to nasal cannula oxygen Cardiovascular status: blood pressure returned to baseline and stable Postop Assessment: no signs of nausea or vomiting Anesthetic complications: no    Last Vitals:  Filed Vitals:   09/12/15 1947 09/12/15 2000  BP: 117/104 86/67  Pulse: 102 98  Temp: 36.9 C   Resp:  22    Last Pain:  Filed Vitals:   09/12/15 2004  PainSc: Petrolia Edward Turk

## 2015-09-13 ENCOUNTER — Encounter (HOSPITAL_COMMUNITY): Payer: Self-pay | Admitting: Surgery

## 2015-09-13 DIAGNOSIS — Z79899 Other long term (current) drug therapy: Secondary | ICD-10-CM | POA: Diagnosis not present

## 2015-09-13 DIAGNOSIS — Z79891 Long term (current) use of opiate analgesic: Secondary | ICD-10-CM | POA: Diagnosis not present

## 2015-09-13 DIAGNOSIS — E876 Hypokalemia: Secondary | ICD-10-CM | POA: Diagnosis present

## 2015-09-13 DIAGNOSIS — L02411 Cutaneous abscess of right axilla: Secondary | ICD-10-CM | POA: Diagnosis present

## 2015-09-13 DIAGNOSIS — L03111 Cellulitis of right axilla: Secondary | ICD-10-CM | POA: Diagnosis present

## 2015-09-13 DIAGNOSIS — M79621 Pain in right upper arm: Secondary | ICD-10-CM | POA: Diagnosis present

## 2015-09-13 LAB — CBC
HEMATOCRIT: 32.9 % — AB (ref 36.0–46.0)
Hemoglobin: 10.8 g/dL — ABNORMAL LOW (ref 12.0–15.0)
MCH: 27.8 pg (ref 26.0–34.0)
MCHC: 32.8 g/dL (ref 30.0–36.0)
MCV: 84.6 fL (ref 78.0–100.0)
PLATELETS: 254 10*3/uL (ref 150–400)
RBC: 3.89 MIL/uL (ref 3.87–5.11)
RDW: 13.5 % (ref 11.5–15.5)
WBC: 12.8 10*3/uL — ABNORMAL HIGH (ref 4.0–10.5)

## 2015-09-13 LAB — BASIC METABOLIC PANEL
Anion gap: 10 (ref 5–15)
BUN: 15 mg/dL (ref 6–20)
CHLORIDE: 100 mmol/L — AB (ref 101–111)
CO2: 26 mmol/L (ref 22–32)
CREATININE: 0.6 mg/dL (ref 0.44–1.00)
Calcium: 8.3 mg/dL — ABNORMAL LOW (ref 8.9–10.3)
GFR calc non Af Amer: >60 mL/min (ref >60)
Glucose, Bld: 109 mg/dL — ABNORMAL HIGH (ref 65–99)
POTASSIUM: 3.2 mmol/L — AB (ref 3.5–5.1)
Sodium: 136 mmol/L (ref 135–145)

## 2015-09-13 MED ORDER — SODIUM CHLORIDE 0.9% FLUSH: 3.0000 mL | INTRAVENOUS | Status: DC | PRN

## 2015-09-13 MED ORDER — POTASSIUM CHLORIDE CRYS ER 20 MEQ PO TBCR
40.0000 meq | EXTENDED_RELEASE_TABLET | Freq: Once | ORAL | Status: AC
  Administered 2015-09-13: 40 meq via ORAL
  Filled 2015-09-13 (×2): qty 2

## 2015-09-13 MED ORDER — SODIUM CHLORIDE 0.9% FLUSH
3.0000 mL | Freq: Two times a day (BID) | INTRAVENOUS | Status: DC
  Administered 2015-09-14: 3 mL via INTRAVENOUS

## 2015-09-13 MED ORDER — DIPHENHYDRAMINE HCL 25 MG PO CAPS
25.0000 mg | ORAL_CAPSULE | Freq: Four times a day (QID) | ORAL | Status: DC | PRN
  Administered 2015-09-13: 25 mg via ORAL
  Filled 2015-09-13: qty 1

## 2015-09-13 NOTE — Progress Notes (Signed)
Discussed with E.Riebock, NP, that we do not have breast binders, only abd binders. She stated we may continue with dsg as is, using minimal tape. Mallika Sanmiguel, [employer]

## 2015-09-13 NOTE — Progress Notes (Signed)
Patient ID: Jenny Terry, female   DOB: 02/01/1951, 65 y.o.   MRN: [id number]     [address] [place], [place] [address]    Phone: [phone] FAX: [phone]     Subjective: Burning pain at times.  Afebrile.  VSS.  WBC down from 17.6k to 12.8k.  k 3.2   Objective:  Vital signs:  Filed Vitals:   09/12/15 2245 09/12/15 2249 09/12/15 2339 09/13/15 0511  BP:  101/52 104/60 106/60  Pulse:  79 80 87  Temp:  98.3 F (36.8 C) 98.5 F (36.9 C) 98 F (36.7 C)  TempSrc:  Oral Oral Oral  Resp:  16 15 16   Height: 5' 5.5" (1.664 m)     Weight: 65.772 kg (145 lb)     SpO2:  94% 96% 97%    Last BM Date: 09/10/15 (pt states going 2 days without bm is normal for her)  Intake/Output   Yesterday:  04/26 0701 - 04/27 0700 In: 600 [I.V.:600] Out: 255 [Urine:250; Blood:5] This shift:    I/O last 3 completed shifts: In: 600 [I.V.:600] Out: 255 [Urine:250; Blood:5]    Physical Exam: General: Pt awake/alert/oriented x4 in no acute distress Skin: right axillae-penrose in place, iodoform packing removed, cellulitis on breast and right back, outlined with a marker. Induration noted around the 3 incisions.     Problem List:   Principal Problem:   Cellulitis of axilla, right    Results:   Labs: Results for orders placed or performed during the hospital encounter of 09/12/15 (from the past 48 hour(s))  Culture, routine-abscess     Status: None (Preliminary result)   Collection Time: 09/12/15  5:45 PM  Result Value Ref Range   Specimen Description ABSCESS RIGHT AXILLARY ABSCESS    Special Requests NONE    Gram Stain      ABUNDANT WBC PRESENT,BOTH PMN AND MONONUCLEAR NO SQUAMOUS EPITHELIAL CELLS SEEN MODERATE GRAM POSITIVE COCCI IN PAIRS IN CLUSTERS Performed at [facility]    Culture PENDING    Report Status PENDING   Surgical pcr screen     Status: None   Collection Time: 09/12/15  6:07 PM   Result Value Ref Range   MRSA, PCR NEGATIVE NEGATIVE   Staphylococcus aureus NEGATIVE NEGATIVE    Comment:        The Xpert SA Assay (FDA approved for NASAL specimens in patients over 28 years of age), is one component of a comprehensive surveillance program.  Test performance has been validated by [facility] for patients greater than or equal to 36 year old. It is not intended to diagnose infection nor to guide or monitor treatment.   CBC WITH DIFFERENTIAL     Status: Abnormal   Collection Time: 09/12/15  8:07 PM  Result Value Ref Range   WBC 17.6 (H) 4.0 - 10.5 K/uL   RBC 4.39 3.87 - 5.11 MIL/uL   Hemoglobin 12.6 12.0 - 15.0 g/dL   HCT 38.0 36.0 - 46.0 %   MCV 86.6 78.0 - 100.0 fL   MCH 28.7 26.0 - 34.0 pg   MCHC 33.2 30.0 - 36.0 g/dL   RDW 13.5 11.5 - 15.5 %   Platelets 268 150 - 400 K/uL   Neutrophils Relative % 82 %   Neutro Abs 14.4 (H) 1.7 - 7.7 K/uL   Lymphocytes Relative 9 %   Lymphs Abs 1.6 0.7 - 4.0 K/uL  Monocytes Relative 8 %   Monocytes Absolute 1.5 (H) 0.1 - 1.0 K/uL   Eosinophils Relative 1 %   Eosinophils Absolute 0.2 0.0 - 0.7 K/uL   Basophils Relative 0 %   Basophils Absolute 0.1 0.0 - 0.1 K/uL  Comprehensive metabolic panel     Status: Abnormal   Collection Time: 09/12/15  8:07 PM  Result Value Ref Range   Sodium 137 135 - 145 mmol/L   Potassium 3.8 3.5 - 5.1 mmol/L   Chloride 99 (L) 101 - 111 mmol/L   CO2 24 22 - 32 mmol/L   Glucose, Bld 114 (H) 65 - 99 mg/dL   BUN 21 (H) 6 - 20 mg/dL   Creatinine, Ser 0.77 0.44 - 1.00 mg/dL   Calcium 8.9 8.9 - 10.3 mg/dL   Total Protein 6.7 6.5 - 8.1 g/dL   Albumin 3.2 (L) 3.5 - 5.0 g/dL   AST 17 15 - 41 U/L   ALT 21 14 - 54 U/L   Alkaline Phosphatase 83 38 - 126 U/L   Total Bilirubin 1.0 0.3 - 1.2 mg/dL   GFR calc non Af Amer >60 >60 mL/min   GFR calc Af Amer >60 >60 mL/min    Comment: (NOTE) The eGFR has been calculated using the CKD EPI equation. This calculation has not been validated in all  clinical situations. eGFR's persistently <60 mL/min signify possible Chronic Kidney Disease.    Anion gap 14 5 - 15  Basic metabolic panel     Status: Abnormal   Collection Time: 09/13/15  4:24 AM  Result Value Ref Range   Sodium 136 135 - 145 mmol/L   Potassium 3.2 (L) 3.5 - 5.1 mmol/L   Chloride 100 (L) 101 - 111 mmol/L   CO2 26 22 - 32 mmol/L   Glucose, Bld 109 (H) 65 - 99 mg/dL   BUN 15 6 - 20 mg/dL   Creatinine, Ser 0.60 0.44 - 1.00 mg/dL   Calcium 8.3 (L) 8.9 - 10.3 mg/dL   GFR calc non Af Amer >60 >60 mL/min   GFR calc Af Amer >60 >60 mL/min    Comment: (NOTE) The eGFR has been calculated using the CKD EPI equation. This calculation has not been validated in all clinical situations. eGFR's persistently <60 mL/min signify possible Chronic Kidney Disease.    Anion gap 10 5 - 15  CBC     Status: Abnormal   Collection Time: 09/13/15  4:24 AM  Result Value Ref Range   WBC 12.8 (H) 4.0 - 10.5 K/uL   RBC 3.89 3.87 - 5.11 MIL/uL   Hemoglobin 10.8 (L) 12.0 - 15.0 g/dL   HCT 32.9 (L) 36.0 - 46.0 %   MCV 84.6 78.0 - 100.0 fL   MCH 27.8 26.0 - 34.0 pg   MCHC 32.8 30.0 - 36.0 g/dL   RDW 13.5 11.5 - 15.5 %   Platelets 254 150 - 400 K/uL    Imaging / Studies: No results found.  Medications / Allergies:  Scheduled Meds: . heparin  5,000 Units Subcutaneous Q8H  . piperacillin-tazobactam (ZOSYN)  IV  3.375 g Intravenous Q8H  . vancomycin  750 mg Intravenous Q12H   Continuous Infusions: . dextrose 5 % and 0.45 % NaCl with KCl 20 mEq/L 75 mL/hr at 09/12/15 2338   PRN Meds:.HYDROcodone-acetaminophen, HYDROmorphone (DILAUDID) injection, LORazepam, meclizine  Antibiotics: Anti-infectives    Start     Dose/Rate Route Frequency Ordered Stop   09/13/15 0600  vancomycin (VANCOCIN) IVPB 750 mg/150  ml premix     750 mg 150 mL/hr over 60 Minutes Intravenous Every 12 hours 09/12/15 2117     09/13/15 0400  piperacillin-tazobactam (ZOSYN) IVPB 3.375 g     3.375 g 12.5 mL/hr over  240 Minutes Intravenous Every 8 hours 09/12/15 1814     09/12/15 2000  vancomycin (VANCOCIN) IVPB 1000 mg/200 mL premix     1,000 mg 200 mL/hr over 60 Minutes Intravenous  Once 09/12/15 1908 09/12/15 1926   09/12/15 2000  piperacillin-tazobactam (ZOSYN) IVPB 3.375 g     3.375 g 100 mL/hr over 30 Minutes Intravenous  Once 09/12/15 1911 09/12/15 1918        Assessment/Plan POD#1 I&D right axillary abscess---Dr. Hassell Done Cellulitis -continue with IV antibiotics, leave penrose drain, apply abd BID and PRN -shower, mobilize, IS ID-Vanc/zosyn D#1.  GPC on culture, follow  VTE prophylaxis-scd, heparin FEN-hypokalemia, give 46mq  EErby Pian ANP-BC [facility] Pager [phone](7A-4:30P)   09/13/2015 8:59 AM

## 2015-09-14 LAB — CULTURE, ROUTINE-ABSCESS

## 2015-09-14 LAB — CBC
HCT: 35.3 % — ABNORMAL LOW (ref 36.0–46.0)
HEMOGLOBIN: 11.5 g/dL — AB (ref 12.0–15.0)
MCH: 28.4 pg (ref 26.0–34.0)
MCHC: 32.6 g/dL (ref 30.0–36.0)
MCV: 87.2 fL (ref 78.0–100.0)
Platelets: 316 10*3/uL (ref 150–400)
RBC: 4.05 MIL/uL (ref 3.87–5.11)
RDW: 13.5 % (ref 11.5–15.5)
WBC: 8 10*3/uL (ref 4.0–10.5)

## 2015-09-14 LAB — BASIC METABOLIC PANEL
Anion gap: 8 (ref 5–15)
BUN: 10 mg/dL (ref 6–20)
CHLORIDE: 103 mmol/L (ref 101–111)
CO2: 30 mmol/L (ref 22–32)
Calcium: 8.4 mg/dL — ABNORMAL LOW (ref 8.9–10.3)
Creatinine, Ser: 0.53 mg/dL (ref 0.44–1.00)
GFR calc non Af Amer: >60 mL/min (ref >60)
Glucose, Bld: 94 mg/dL (ref 65–99)
POTASSIUM: 3.3 mmol/L — AB (ref 3.5–5.1)
SODIUM: 141 mmol/L (ref 135–145)

## 2015-09-14 LAB — MAGNESIUM: MAGNESIUM: 1.9 mg/dL (ref 1.7–2.4)

## 2015-09-14 MED ORDER — CEFAZOLIN SODIUM-DEXTROSE 2-4 GM/100ML-% IV SOLN
2.0000 g | Freq: Once | INTRAVENOUS | Status: AC
  Administered 2015-09-14: 2 g via INTRAVENOUS
  Filled 2015-09-14 (×2): qty 100

## 2015-09-14 MED ORDER — ACETAMINOPHEN 325 MG PO TABS
325.0000 mg | ORAL_TABLET | Freq: Four times a day (QID) | ORAL | Status: DC | PRN
  Administered 2015-09-14: 650 mg via ORAL
  Filled 2015-09-14: qty 2

## 2015-09-14 MED ORDER — POTASSIUM CHLORIDE CRYS ER 20 MEQ PO TBCR
40.0000 meq | EXTENDED_RELEASE_TABLET | Freq: Once | ORAL | Status: AC
  Administered 2015-09-14: 40 meq via ORAL
  Filled 2015-09-14: qty 2

## 2015-09-14 MED ORDER — CEFAZOLIN SODIUM 1-5 GM-% IV SOLN
1.0000 g | Freq: Three times a day (TID) | INTRAVENOUS | Status: DC
  Administered 2015-09-14 – 2015-09-15 (×2): 1 g via INTRAVENOUS
  Filled 2015-09-14 (×3): qty 50

## 2015-09-14 MED ORDER — DEXTROSE 5 % IV SOLN
3.0000 g | Freq: Once | INTRAVENOUS | Status: AC
  Administered 2015-09-14: 3 g via INTRAVENOUS
  Filled 2015-09-14: qty 6

## 2015-09-14 MED ORDER — IBUPROFEN 200 MG PO TABS: 400.0000 mg | ORAL_TABLET | Freq: Four times a day (QID) | ORAL | Status: DC | PRN

## 2015-09-14 NOTE — Progress Notes (Signed)
Patient ID: Jenny Terry, female   DOB: Apr 03, 1951, 65 y.o.   MRN: [id number]     [address] [place], [place] [address]    Phone: [phone] FAX: [phone]     Subjective: Wbc normal.  Afebrile.  k 3.3. On clears, not sure why, order to advance as tolerated. Wants to go home.   Objective:  Vital signs:  Filed Vitals:   09/13/15 1032 09/13/15 1413 09/13/15 2141 09/14/15 0547  BP: 92/49 110/61 97/47 101/53  Pulse: 79 83 72 71  Temp: 98.9 F (37.2 C) 98.6 F (37 C) 98.7 F (37.1 C) 98.6 F (37 C)  TempSrc: Oral Oral Oral Oral  Resp: _0 Height:      Weight:      SpO2: 95% 99% 94% 97%    Last BM Date: 09/10/15  Intake/Output   Yesterday:  04/27 0701 - 04/28 0700 In: 1252.5 [I.V.:852.5; IV Piggyback:400] Out: 1500 [Urine:1500] This shift:    I/O last 3 completed shifts: In: 1852.5 [I.V.:1452.5; IV Piggyback:400] Out: 9675 [Urine:1750; Blood:5]     Physical Exam: General: Pt awake/alert/oriented x4 in no acute distress Skin: right axillae-penrose in place,cellulitis on breast and right back, outlined with a marker. Has receded about 10-15%, less induration noted around the 3 incisions.    Problem List:   Principal Problem:   Cellulitis of axilla, right    Results:   Labs: Results for orders placed or performed during the hospital encounter of 09/12/15 (from the past 48 hour(s))  Culture, routine-abscess     Status: None (Preliminary result)   Collection Time: 09/12/15  5:45 PM  Result Value Ref Range   Specimen Description ABSCESS RIGHT AXILLARY ABSCESS    Special Requests NONE    Gram Stain      ABUNDANT WBC PRESENT,BOTH PMN AND MONONUCLEAR NO SQUAMOUS EPITHELIAL CELLS SEEN MODERATE GRAM POSITIVE COCCI IN PAIRS IN CLUSTERS Performed at [facility]    Culture      ABUNDANT STAPHYLOCOCCUS AUREUS Note: RIFAMPIN AND GENTAMICIN SHOULD NOT BE USED AS SINGLE  DRUGS FOR TREATMENT OF STAPH INFECTIONS. Performed at [facility]    Report Status PENDING   Surgical pcr screen     Status: None   Collection Time: 09/12/15  6:07 PM  Result Value Ref Range   MRSA, PCR NEGATIVE NEGATIVE   Staphylococcus aureus NEGATIVE NEGATIVE    Comment:        The Xpert SA Assay (FDA approved for NASAL specimens in patients over 35 years of age), is one component of a comprehensive surveillance program.  Test performance has been validated by [facility] for patients greater than or equal to 37 year old. It is not intended to diagnose infection nor to guide or monitor treatment.   CBC WITH DIFFERENTIAL     Status: Abnormal   Collection Time: 09/12/15  8:07 PM  Result Value Ref Range   WBC 17.6 (H) 4.0 - 10.5 K/uL   RBC 4.39 3.87 - 5.11 MIL/uL   Hemoglobin 12.6 12.0 - 15.0 g/dL   HCT 38.0 36.0 - 46.0 %   MCV 86.6 78.0 - 100.0 fL   MCH 28.7 26.0 - 34.0 pg   MCHC 33.2 30.0 - 36.0 g/dL   RDW 13.5 11.5 - 15.5 %   Platelets 268 150 - 400 K/uL   Neutrophils Relative % 82 %   Neutro Abs 14.4 (H)  1.7 - 7.7 K/uL   Lymphocytes Relative 9 %   Lymphs Abs 1.6 0.7 - 4.0 K/uL   Monocytes Relative 8 %   Monocytes Absolute 1.5 (H) 0.1 - 1.0 K/uL   Eosinophils Relative 1 %   Eosinophils Absolute 0.2 0.0 - 0.7 K/uL   Basophils Relative 0 %   Basophils Absolute 0.1 0.0 - 0.1 K/uL  Comprehensive metabolic panel     Status: Abnormal   Collection Time: 09/12/15  8:07 PM  Result Value Ref Range   Sodium 137 135 - 145 mmol/L   Potassium 3.8 3.5 - 5.1 mmol/L   Chloride 99 (L) 101 - 111 mmol/L   CO2 24 22 - 32 mmol/L   Glucose, Bld 114 (H) 65 - 99 mg/dL   BUN 21 (H) 6 - 20 mg/dL   Creatinine, Ser 0.77 0.44 - 1.00 mg/dL   Calcium 8.9 8.9 - 10.3 mg/dL   Total Protein 6.7 6.5 - 8.1 g/dL   Albumin 3.2 (L) 3.5 - 5.0 g/dL   AST 17 15 - 41 U/L   ALT 21 14 - 54 U/L   Alkaline Phosphatase 83 38 - 126 U/L   Total Bilirubin 1.0 0.3 - 1.2 mg/dL   GFR calc non Af  Amer >60 >60 mL/min   GFR calc Af Amer >60 >60 mL/min    Comment: (NOTE) The eGFR has been calculated using the CKD EPI equation. This calculation has not been validated in all clinical situations. eGFR's persistently <60 mL/min signify possible Chronic Kidney Disease.    Anion gap 14 5 - 15  Basic metabolic panel     Status: Abnormal   Collection Time: 09/13/15  4:24 AM  Result Value Ref Range   Sodium 136 135 - 145 mmol/L   Potassium 3.2 (L) 3.5 - 5.1 mmol/L   Chloride 100 (L) 101 - 111 mmol/L   CO2 26 22 - 32 mmol/L   Glucose, Bld 109 (H) 65 - 99 mg/dL   BUN 15 6 - 20 mg/dL   Creatinine, Ser 0.60 0.44 - 1.00 mg/dL   Calcium 8.3 (L) 8.9 - 10.3 mg/dL   GFR calc non Af Amer >60 >60 mL/min   GFR calc Af Amer >60 >60 mL/min    Comment: (NOTE) The eGFR has been calculated using the CKD EPI equation. This calculation has not been validated in all clinical situations. eGFR's persistently <60 mL/min signify possible Chronic Kidney Disease.    Anion gap 10 5 - 15  CBC     Status: Abnormal   Collection Time: 09/13/15  4:24 AM  Result Value Ref Range   WBC 12.8 (H) 4.0 - 10.5 K/uL   RBC 3.89 3.87 - 5.11 MIL/uL   Hemoglobin 10.8 (L) 12.0 - 15.0 g/dL   HCT 32.9 (L) 36.0 - 46.0 %   MCV 84.6 78.0 - 100.0 fL   MCH 27.8 26.0 - 34.0 pg   MCHC 32.8 30.0 - 36.0 g/dL   RDW 13.5 11.5 - 15.5 %   Platelets 254 150 - 400 K/uL  CBC     Status: Abnormal   Collection Time: 09/14/15  3:58 AM  Result Value Ref Range   WBC 8.0 4.0 - 10.5 K/uL   RBC 4.05 3.87 - 5.11 MIL/uL   Hemoglobin 11.5 (L) 12.0 - 15.0 g/dL   HCT 35.3 (L) 36.0 - 46.0 %   MCV 87.2 78.0 - 100.0 fL   MCH 28.4 26.0 - 34.0 pg   MCHC 32.6 30.0 -  36.0 g/dL   RDW 13.5 11.5 - 15.5 %   Platelets 316 150 - 400 K/uL  Basic metabolic panel     Status: Abnormal   Collection Time: 09/14/15  3:58 AM  Result Value Ref Range   Sodium 141 135 - 145 mmol/L   Potassium 3.3 (L) 3.5 - 5.1 mmol/L   Chloride 103 101 - 111 mmol/L   CO2 30 22  - 32 mmol/L   Glucose, Bld 94 65 - 99 mg/dL   BUN 10 6 - 20 mg/dL   Creatinine, Ser 0.53 0.44 - 1.00 mg/dL   Calcium 8.4 (L) 8.9 - 10.3 mg/dL   GFR calc non Af Amer >60 >60 mL/min   GFR calc Af Amer >60 >60 mL/min    Comment: (NOTE) The eGFR has been calculated using the CKD EPI equation. This calculation has not been validated in all clinical situations. eGFR's persistently <60 mL/min signify possible Chronic Kidney Disease.    Anion gap 8 5 - 15    Imaging / Studies: No results found.  Medications / Allergies:  Scheduled Meds: . heparin  5,000 Units Subcutaneous Q8H  . piperacillin-tazobactam (ZOSYN)  IV  3.375 g Intravenous Q8H  . sodium chloride flush  3 mL Intravenous Q12H  . vancomycin  750 mg Intravenous Q12H   Continuous Infusions:  PRN Meds:.diphenhydrAMINE, HYDROcodone-acetaminophen, HYDROmorphone (DILAUDID) injection, LORazepam, meclizine, sodium chloride flush  Antibiotics: Anti-infectives    Start     Dose/Rate Route Frequency Ordered Stop   09/13/15 0600  vancomycin (VANCOCIN) IVPB 750 mg/150 ml premix     750 mg 150 mL/hr over 60 Minutes Intravenous Every 12 hours 09/12/15 2117     09/13/15 0400  piperacillin-tazobactam (ZOSYN) IVPB 3.375 g     3.375 g 12.5 mL/hr over 240 Minutes Intravenous Every 8 hours 09/12/15 1814     09/12/15 2000  vancomycin (VANCOCIN) IVPB 1000 mg/200 mL premix     1,000 mg 200 mL/hr over 60 Minutes Intravenous  Once 09/12/15 1908 09/12/15 1926   09/12/15 2000  piperacillin-tazobactam (ZOSYN) IVPB 3.375 g     3.375 g 100 mL/hr over 30 Minutes Intravenous  Once 09/12/15 1911 09/12/15 1918         Assessment/Plan POD#2 I&D right axillary abscess---Dr. Hassell Done Cellulitis -continue with IV antibiotics for surrounding cellulitis, leave penrose drain, apply abd BID and PRN -shower, mobilize, IS ID-Vanc/zosyn D#2. GPC on culture, follow  VTE prophylaxis-scd, heparin FEN-hypokalemia, give 46mq Dispo-once cellulitis has  improved   EErby Pian ANP-BC [facility] Pager [phone](7A-4:30P)   09/14/2015 8:31 AM

## 2015-09-14 NOTE — Progress Notes (Deleted)
MD paged re: BP 191/89 patient asymptomatic.  Awaiting returned call.

## 2015-09-14 NOTE — Progress Notes (Addendum)
Pharmacy Antibiotic Note  Jenny Terry is a 65 y.o. female admitted on 09/12/2015 with R axillary abscess. Fluid sent for Cx from Urgent care growing GPCs in pairs/chains.  Failed medical management with Keflex + Bactrim and now pain/erythema has rapidly spread since initial presentation and patient taken to OR for I&D. Pharmacy has been consulted for vancomycin dosing. Zosyn per MD.  Temp: Afebrile WBC: improved to wnl Renal: SCr stable wnl; CrCl 63 CG  Plan:  Spoke with PA Emina regarding Culture results and will narrow to IV Ancef; she notes source control was successful but patient does still have significant cellulitis area on exam.  Will await results of second abscess culture with low threshold to add back MRSA coverage if clinically deteriorates.  Expect initially failed PO Keflex due to lack of source control.  Cefazolin 2g IV x 1, then 1g IV q8 hr; will monitor for 24 hr more to ensure good response and consistent Cx results, then sign off as renal function, temp, and WBC all stable wnl.  Height: 5' 5.5" (166.4 cm) Weight: 145 lb (65.772 kg) IBW/kg (Calculated) : 58.15 Temp (24hrs), Avg:98.6 F (37 C), Min:98.6 F (37 C), Max:98.7 F (37.1 C)   Recent Labs Lab 09/12/15 2007 09/13/15 0424 09/14/15 0358  WBC 17.6* 12.8* 8.0  CREATININE 0.77 0.60 0.53    Estimated Creatinine Clearance: 62.7 mL/min (by C-G formula based on Cr of 0.53).    No Known Allergies  Antimicrobials this admission: vancomycin 4/26 >> 4/28 Zosyn 4/26 >> 4/28 Ancef 4/28 >>  Dose adjustments this admission: ---  Microbiology results: 4/25 abscess Cx: abundant MSSA 4/26 abscess: abundant S aureus  4/26 MRSA PCR: neg  Thank you for allowing pharmacy to be a part of this patient's care.  Reuel Boom, PharmD, BCPS Pager: [phone] 09/14/2015, 11:01 AM

## 2015-09-15 MED ORDER — OXYCODONE-ACETAMINOPHEN 5-325 MG PO TABS: 1.0000 | ORAL_TABLET | ORAL | Status: DC | PRN

## 2015-09-15 MED ORDER — AMOXICILLIN-POT CLAVULANATE 875-125 MG PO TABS: 1.0000 | ORAL_TABLET | Freq: Two times a day (BID) | ORAL | Status: DC

## 2015-09-15 NOTE — Progress Notes (Signed)
3 Days Post-Op  Subjective: Feels good and wants to go home  Objective: Vital signs in last 24 hours: Temp:  [97.7 F (36.5 C)-98.9 F (37.2 C)] 98.3 F (36.8 C) (04/29 0610) Pulse Rate:  [67-74] 67 (04/29 0610) Resp:  [15-16] 16 (04/29 0610) BP: (94-119)/(47-73) 119/73 mmHg (04/29 0610) SpO2:  [93 %-99 %] 99 % (04/29 0610) Last BM Date: 09/10/15  Intake/Output from previous day: 04/28 0701 - 04/29 0700 In: 660 [P.O.:360; I.V.:150; IV Piggyback:150] Out: -  Intake/Output this shift:    Resp: clear to auscultation bilaterally Chest wall: redness improving. penrose in place. moderate drainage Cardio: regular rate and rhythm GI: soft, non-tender; bowel sounds normal; no masses,  no organomegaly  Lab Results:   Recent Labs  09/13/15 0424 09/14/15 0358  WBC 12.8* 8.0  HGB 10.8* 11.5*  HCT 32.9* 35.3*  PLT 254 316   BMET  Recent Labs  09/13/15 0424 09/14/15 0358  NA 136 141  K 3.2* 3.3*  CL 100* 103  CO2 26 30  GLUCOSE 109* 94  BUN 15 10  CREATININE 0.60 0.53  CALCIUM 8.3* 8.4*   PT/INR No results for input(s): LABPROT, INR in the last 72 hours. ABG No results for input(s): PHART, HCO3 in the last 72 hours.  Invalid input(s): PCO2, PO2  Studies/Results: No results found.  Anti-infectives: Anti-infectives    Start     Dose/Rate Route Frequency Ordered Stop   09/14/15 2200  ceFAZolin (ANCEF) IVPB 1 g/50 mL premix     1 g 100 mL/hr over 30 Minutes Intravenous Every 8 hours 09/14/15 1120     09/14/15 1200  ceFAZolin (ANCEF) IVPB 2g/100 mL premix     2 g 200 mL/hr over 30 Minutes Intravenous  Once 09/14/15 1120 09/14/15 1410   09/13/15 0600  vancomycin (VANCOCIN) IVPB 750 mg/150 ml premix  Status:  Discontinued     750 mg 150 mL/hr over 60 Minutes Intravenous Every 12 hours 09/12/15 2117 09/14/15 1120   09/13/15 0400  piperacillin-tazobactam (ZOSYN) IVPB 3.375 g  Status:  Discontinued     3.375 g 12.5 mL/hr over 240 Minutes Intravenous Every 8 hours  09/12/15 1814 09/14/15 1120   09/12/15 2000  vancomycin (VANCOCIN) IVPB 1000 mg/200 mL premix     1,000 mg 200 mL/hr over 60 Minutes Intravenous  Once 09/12/15 1908 09/12/15 1926   09/12/15 2000  piperacillin-tazobactam (ZOSYN) IVPB 3.375 g     3.375 g 100 mL/hr over 30 Minutes Intravenous  Once 09/12/15 1911 09/12/15 1918      Assessment/Plan: s/p Procedure(s): INCISION AND DRAINAGE ABSCESS RIGHT AXILLARY (Right) Discharge  LOS: 3 days    TOTH III,PAUL S 09/15/2015

## 2015-09-18 LAB — CULTURE, ROUTINE-ABSCESS

## 2015-09-28 NOTE — Discharge Summary (Signed)
Physician Discharge Summary  Jenny Terry [id number] DOB: [birth year] DOA: 09/12/2015  PCP: Precious Reel, MD  Consultation: none  Admit date: 09/12/2015 Discharge date: 4/29//2017  Recommendations for Outpatient Follow-up:   Follow-up Information    Follow up with MARTIN,MATTHEW B, MD In 2 weeks.   Specialty:  General Surgery   Contact information:   [address] [place] [address] [phone]      Discharge Diagnoses:  1. Right axillary abscess 2. cellulitis   Surgical Procedure: I&D right axillary abscess---Dr. Hassell Done  Discharge Condition: stable Disposition: home  Diet recommendation: regular  Filed Weights   09/12/15 2245  Weight: 65.772 kg (145 lb)     Filed Vitals:   09/14/15 2244 09/15/15 0610  BP: 94/47 119/73  Pulse: 72 67  Temp: 97.7 F (36.5 C) 98.3 F (36.8 C)  Resp: 16 16     Hospital Course:  Jenny Terry is a 64 year old female who was seen in the office for a axillary abscess.  It was felt she needed I&D in the OR.  Post operatively, she was kept on IV antibiotics for cellulitis.  Cultures yielded GPC.  Antibiotics were adjusted.  On POD#3 she was felt stable for discharge with septra and penrose drain.  Follow up with Dr. Hassell Done for a wound check.   Discharge Instructions  Discharge Instructions    Call MD for:  difficulty breathing, headache or visual disturbances    Complete by:  As directed      Call MD for:  extreme fatigue    Complete by:  As directed      Call MD for:  hives    Complete by:  As directed      Call MD for:  persistant dizziness or light-headedness    Complete by:  As directed      Call MD for:  persistant nausea and vomiting    Complete by:  As directed      Call MD for:  redness, tenderness, or signs of infection (pain, swelling, redness, odor or green/yellow discharge around incision site)    Complete by:  As directed      Call MD for:  severe uncontrolled pain    Complete by:  As directed       Call MD for:  temperature >100.4    Complete by:  As directed      Diet - low sodium heart healthy    Complete by:  As directed      Discharge instructions    Complete by:  As directed   May shower. Replace dressing as needed     Increase activity slowly    Complete by:  As directed             Medication List    TAKE these medications        amoxicillin-clavulanate 875-125 MG tablet  Commonly known as:  AUGMENTIN  Take 1 tablet by mouth 2 (two) times daily.     cephALEXin 500 MG capsule  Commonly known as:  KEFLEX  Take 1 capsule (500 mg total) by mouth 4 (four) times daily.     fluticasone 50 MCG/ACT nasal spray  Commonly known as:  FLONASE  Place into both nostrils daily.     HYDROcodone-acetaminophen 5-325 MG tablet  Commonly known as:  NORCO  Take 1 tablet by mouth every 4 (four) hours as needed for moderate pain.     ibuprofen 200 MG tablet  Commonly known  as:  ADVIL,MOTRIN  Take 600 mg by mouth every 6 (six) hours as needed for moderate pain.     oxyCODONE-acetaminophen 5-325 MG tablet  Commonly known as:  ROXICET  Take 1-2 tablets by mouth every 4 (four) hours as needed for severe pain.     PROAIR HFA 108 (90 Base) MCG/ACT inhaler  Generic drug:  albuterol  INHALE 2 PUFFS EVERY 6 HRS AS NEEDED FOR WHEEZING      ASK your doctor about these medications        sulfamethoxazole-trimethoprim 800-160 MG tablet  Commonly known as:  BACTRIM DS,SEPTRA DS  Take 1 tablet by mouth 2 (two) times daily.  Ask about: Should I take this medication?           Follow-up Information    Follow up with Johnathan Hausen B, MD In 2 weeks.   Specialty:  General Surgery   Contact information:   [place] [address] [phone]        The results of significant diagnostics from this hospitalization (including imaging, microbiology, ancillary and laboratory) are listed below for reference.    Significant Diagnostic Studies: No results  found.  Microbiology: No results found for this or any previous visit (from the past 240 hour(s)).   Labs: Basic Metabolic Panel: No results for input(s): NA, K, CL, CO2, GLUCOSE, BUN, CREATININE, CALCIUM, MG, PHOS in the last 168 hours. Liver Function Tests: No results for input(s): AST, ALT, ALKPHOS, BILITOT, PROT, ALBUMIN in the last 168 hours. No results for input(s): LIPASE, AMYLASE in the last 168 hours. No results for input(s): AMMONIA in the last 168 hours. CBC: No results for input(s): WBC, NEUTROABS, HGB, HCT, MCV, PLT in the last 168 hours. Cardiac Enzymes: No results for input(s): CKTOTAL, CKMB, CKMBINDEX, TROPONINI in the last 168 hours. BNP: BNP (last 3 results) No results for input(s): BNP in the last 8760 hours.  ProBNP (last 3 results) No results for input(s): PROBNP in the last 8760 hours.  CBG: No results for input(s): GLUCAP in the last 168 hours.  Principal Problem:   Cellulitis of axilla, right   Signed:  Viera Okonski, ANP-BC

## 2016-05-19 HISTORY — PX: COLONOSCOPY: SHX174

## 2016-05-19 HISTORY — PX: POLYPECTOMY: SHX149

## 2016-07-15 ENCOUNTER — Encounter: Payer: Self-pay | Admitting: Internal Medicine

## 2016-08-08 ENCOUNTER — Encounter: Payer: Self-pay | Admitting: Internal Medicine

## 2016-09-15 ENCOUNTER — Telehealth (INDEPENDENT_AMBULATORY_CARE_PROVIDER_SITE_OTHER): Payer: Self-pay | Admitting: Orthopaedic Surgery

## 2016-11-07 ENCOUNTER — Encounter: Payer: Medicare Other | Admitting: Internal Medicine

## 2016-11-07 ENCOUNTER — Ambulatory Visit (AMBULATORY_SURGERY_CENTER): Payer: Self-pay

## 2016-11-07 VITALS — Ht 65.5 in | Wt 148.0 lb

## 2016-11-07 DIAGNOSIS — Z86010 Personal history of colonic polyps: Secondary | ICD-10-CM

## 2016-11-07 MED ORDER — NA SULFATE-K SULFATE-MG SULF 17.5-3.13-1.6 GM/177ML PO SOLN: ORAL | 0 refills | Status: DC

## 2016-11-07 NOTE — Progress Notes (Signed)
Per pt, no allergies to soy or egg products.Pt not taking any weight loss meds or using  O2 at home.   Pt refused Emmi video. 

## 2016-11-21 ENCOUNTER — Encounter: Payer: Medicare Other | Admitting: Internal Medicine

## 2016-12-05 ENCOUNTER — Encounter: Payer: Self-pay | Admitting: Internal Medicine

## 2016-12-19 ENCOUNTER — Ambulatory Visit (AMBULATORY_SURGERY_CENTER): Payer: BC Managed Care – PPO | Admitting: Internal Medicine

## 2016-12-19 ENCOUNTER — Encounter: Payer: Self-pay | Admitting: Internal Medicine

## 2016-12-19 VITALS — BP 122/71 | HR 57 | Temp 97.7°F | Resp 16 | Ht 65.5 in | Wt 148.0 lb

## 2016-12-19 DIAGNOSIS — D123 Benign neoplasm of transverse colon: Secondary | ICD-10-CM

## 2016-12-19 DIAGNOSIS — Z86010 Personal history of colonic polyps: Secondary | ICD-10-CM | POA: Diagnosis present

## 2016-12-19 DIAGNOSIS — D122 Benign neoplasm of ascending colon: Secondary | ICD-10-CM | POA: Diagnosis not present

## 2016-12-19 MED ORDER — SODIUM CHLORIDE 0.9 % IV SOLN: 500.0000 mL | INTRAVENOUS | Status: DC

## 2016-12-19 NOTE — Progress Notes (Signed)
No problems noted in the recovery room. maw 

## 2016-12-19 NOTE — Op Note (Signed)
[place] Patient Name: Jenny Terry Procedure Date: 12/19/2016 2:15 PM MRN: [id number] Endoscopist: Docia Chuck. Henrene Pastor , MD Age: 66 Referring MD:  Date of Birth: September 01, 1950 Gender: Female Account #: [id number] Procedure:                Colonoscopy with cold snare polypectomy x 3 Indications:              High risk colon cancer surveillance: Personal                            history of non-advanced adenoma. Index exam 2010 Medicines:                Monitored Anesthesia Care Procedure:                Pre-Anesthesia Assessment:                           - Prior to the procedure, a History and Physical                            was performed, and patient medications and                            allergies were reviewed. The patient's tolerance of                            previous anesthesia was also reviewed. The risks                            and benefits of the procedure and the sedation                            options and risks were discussed with the patient.                            All questions were answered, and informed consent                            was obtained. Prior Anticoagulants: The patient has                            taken no previous anticoagulant or antiplatelet                            agents. ASA Grade Assessment: II - A patient with                            mild systemic disease. After reviewing the risks                            and benefits, the patient was deemed in                            satisfactory condition to undergo the procedure.  After obtaining informed consent, the colonoscope                            was passed under direct vision. Throughout the                            procedure, the patient's blood pressure, pulse, and                            oxygen saturations were monitored continuously. The                            Colonoscope was introduced through the anus and                             advanced to the the cecum, identified by                            appendiceal orifice and ileocecal valve. The                            ileocecal valve, appendiceal orifice, and rectum                            were photographed. The quality of the bowel                            preparation was good. The colonoscopy was performed                            without difficulty. The patient tolerated the                            procedure well. The bowel preparation used was                            SUPREP. Scope In: 2:26:55 PM Scope Out: 2:45:26 PM Scope Withdrawal Time: 0 hours 15 minutes 0 seconds  Total Procedure Duration: 0 hours 18 minutes 31 seconds  Findings:                 Three polyps were found in the transverse colon and                            ascending colon. The polyps were 3 to 4 mm in size.                            These polyps were removed with a cold snare.                            Resection and retrieval were complete.                           Multiple diverticula were found in the sigmoid  colon.                           Internal hemorrhoids were found during retroflexion.                           The exam was otherwise without abnormality on                            direct and retroflexion views. Complications:            No immediate complications. Estimated blood loss:                            None. Estimated Blood Loss:     Estimated blood loss: none. Impression:               - Three 3 to 4 mm polyps in the transverse colon                            and in the ascending colon, removed with a cold                            snare. Resected and retrieved.                           - Diverticulosis in the sigmoid colon.                           - Internal hemorrhoids.                           - The examination was otherwise normal on direct                            and retroflexion  views. Recommendation:           - Repeat colonoscopy in 3 - 5 years for                            surveillance.                           - Patient has a contact number available for                            emergencies. The signs and symptoms of potential                            delayed complications were discussed with the                            patient. Return to normal activities tomorrow.                            Written discharge instructions were provided to the  patient.                           - Resume previous diet.                           - Continue present medications.                           - Await pathology results. Docia Chuck. Henrene Pastor, MD 12/19/2016 2:51:03 PM This report has been signed electronically.

## 2016-12-19 NOTE — Progress Notes (Signed)
Pt's states no medical or surgical changes since previsit or office visit. 

## 2016-12-19 NOTE — Progress Notes (Signed)
Called to room to assist during endoscopic procedure.  Patient ID and intended procedure confirmed with present staff. Received instructions for my participation in the procedure from the performing physician.  

## 2016-12-19 NOTE — Progress Notes (Signed)
To recovery, report to Willis, RN, VSS 

## 2016-12-19 NOTE — Patient Instructions (Signed)
YOU HAD AN ENDOSCOPIC PROCEDURE TODAY AT THE [facility]:   Refer to the procedure report that was given to you for any specific questions about what was found during the examination.  If the procedure report does not answer your questions, please call your gastroenterologist to clarify.  If you requested that your care partner not be given the details of your procedure findings, then the procedure report has been included in a sealed envelope for you to review at your convenience later.  YOU SHOULD EXPECT: Some feelings of bloating in the abdomen. Passage of more gas than usual.  Walking can help get rid of the air that was put into your GI tract during the procedure and reduce the bloating. If you had a lower endoscopy (such as a colonoscopy or flexible sigmoidoscopy) you may notice spotting of blood in your stool or on the toilet paper. If you underwent a bowel prep for your procedure, you may not have a normal bowel movement for a few days.  Please Note:  You might notice some irritation and congestion in your nose or some drainage.  This is from the oxygen used during your procedure.  There is no need for concern and it should clear up in a day or so.  SYMPTOMS TO REPORT IMMEDIATELY:   Following lower endoscopy (colonoscopy or flexible sigmoidoscopy):  Excessive amounts of blood in the stool  Significant tenderness or worsening of abdominal pains  Swelling of the abdomen that is new, acute  Fever of 100F or higher   For urgent or emergent issues, a gastroenterologist can be reached at any hour by calling [phone].   DIET:  We do recommend a small meal at first, but then you may proceed to your regular diet.  Drink plenty of fluids but you should avoid alcoholic beverages for 24 hours.  ACTIVITY:  You should plan to take it easy for the rest of today and you should NOT DRIVE or use heavy machinery until tomorrow (because of the sedation medicines used during the test).     FOLLOW UP: Our staff will call the number listed on your records the next business day following your procedure to check on you and address any questions or concerns that you may have regarding the information given to you following your procedure. If we do not reach you, we will leave a message.  However, if you are feeling well and you are not experiencing any problems, there is no need to return our call.  We will assume that you have returned to your regular daily activities without incident.  If any biopsies were taken you will be contacted by phone or by letter within the next 1-3 weeks.  Please call us at [phone] if you have not heard about the biopsies in 3 weeks.    SIGNATURES/CONFIDENTIALITY: You and/or your care partner have signed paperwork which will be entered into your electronic medical record.  These signatures attest to the fact that that the information above on your After Visit Summary has been reviewed and is understood.  Full responsibility of the confidentiality of this discharge information lies with you and/or your care-partner.   Handouts were given to your care partner on polyps, diverticulosis, and hemorrhoids. You may resume your current medications today. Await biopsy results. Please call if any questions or concerns.   

## 2016-12-22 ENCOUNTER — Telehealth: Payer: Self-pay

## 2016-12-22 NOTE — Telephone Encounter (Signed)
  Follow up Call-  Call back number 12/19/2016  Post procedure Call Back phone  # [phone]  Permission to leave phone message Yes  Some recent data might be hidden     Patient questions:  Do you have a fever, pain , or abdominal swelling? No. Pain Score  0 *  Have you tolerated food without any problems? Yes.    Have you been able to return to your normal activities? Yes.    Do you have any questions about your discharge instructions: Diet   No. Medications  No. Follow up visit  No.  Do you have questions or concerns about your Care? No.  Actions: * If pain score is 4 or above: No action needed, pain <4.  No problems noted per pt. maw

## 2016-12-29 ENCOUNTER — Encounter: Payer: Self-pay | Admitting: Internal Medicine

## 2017-07-08 ENCOUNTER — Other Ambulatory Visit: Payer: Self-pay | Admitting: Internal Medicine

## 2017-07-08 DIAGNOSIS — E785 Hyperlipidemia, unspecified: Secondary | ICD-10-CM

## 2018-07-12 ENCOUNTER — Other Ambulatory Visit: Payer: Self-pay | Admitting: Internal Medicine

## 2018-07-12 DIAGNOSIS — E785 Hyperlipidemia, unspecified: Secondary | ICD-10-CM

## 2019-06-07 ENCOUNTER — Ambulatory Visit: Payer: Medicare Other | Attending: Internal Medicine

## 2019-06-07 DIAGNOSIS — Z23 Encounter for immunization: Secondary | ICD-10-CM

## 2019-06-07 NOTE — Progress Notes (Signed)
   Covid-19 Vaccination Clinic  Name:  Jenny Terry    MRN: [id number] DOB: 1951-04-27  06/07/2019  Jenny Terry was observed post Covid-19 immunization for 15 minutes without incidence. She was provided with Vaccine Information Sheet and instruction to access the V-Safe system.   Jenny Terry was instructed to call 911 with any severe reactions post vaccine: Marland Kitchen Difficulty breathing  . Swelling of your face and throat  . A fast heartbeat  . A bad rash all over your body  . Dizziness and weakness    Immunizations Administered    Name Date Dose VIS Date Route   Pfizer COVID-19 Vaccine 06/07/2019  3:39 PM 0.3 mL 04/29/2019 Intramuscular   Manufacturer: [place]   Lot: [id number]   [place]: [id number]

## 2019-06-17 ENCOUNTER — Ambulatory Visit: Payer: BC Managed Care – PPO

## 2019-06-26 ENCOUNTER — Ambulatory Visit: Payer: Medicare Other | Attending: Internal Medicine

## 2019-06-26 DIAGNOSIS — Z23 Encounter for immunization: Secondary | ICD-10-CM

## 2019-06-26 NOTE — Progress Notes (Signed)
   Covid-19 Vaccination Clinic  Name:  Jenny Terry    MRN: [id number] DOB: December 09, 1950  06/26/2019  [name] was observed post Covid-19 immunization for 15 minutes without incidence. She was provided with Vaccine Information Sheet and instruction to access the V-Safe system.   [name] was instructed to call 911 with any severe reactions post vaccine: Marland Kitchen Difficulty breathing  . Swelling of your face and throat  . A fast heartbeat  . A bad rash all over your body  . Dizziness and weakness    Immunizations Administered    Name Date Dose VIS Date Route   Pfizer COVID-19 Vaccine 06/26/2019  8:16 AM 0.3 mL 04/29/2019 Intramuscular   Manufacturer: [place]   Lot: [id number]   [place]: [id number]

## 2019-07-01 ENCOUNTER — Ambulatory Visit: Payer: Medicare Other

## 2021-07-01 ENCOUNTER — Encounter: Payer: Self-pay | Admitting: Internal Medicine

## 2021-08-12 ENCOUNTER — Ambulatory Visit (AMBULATORY_SURGERY_CENTER): Payer: Medicare Other

## 2021-08-12 ENCOUNTER — Other Ambulatory Visit: Payer: Self-pay

## 2021-08-12 VITALS — Ht 65.5 in | Wt 140.0 lb

## 2021-08-12 DIAGNOSIS — Z86010 Personal history of colonic polyps: Secondary | ICD-10-CM

## 2021-08-12 MED ORDER — NA SULFATE-K SULFATE-MG SULF 17.5-3.13-1.6 GM/177ML PO SOLN: 1.0000 | Freq: Once | ORAL | 0 refills | Status: AC

## 2021-08-12 NOTE — Progress Notes (Signed)
No egg or soy allergy known to patient  ?No issues known to pt with past sedation with any surgeries or procedures ?Patient denies ever being told they had issues or difficulty with intubation  ?No FH of Malignant Hyperthermia ?Pt is not on diet pills ?Pt is not on home 02  ?Pt is not on blood thinners  ?Pt denies issues with constipation  ?No A fib or A flutter ?Pt is fully vaccinated for Covid x 2; ?NO PA's for preps discussed with pt in PV today  ?Discussed with pt there will be an out-of-pocket cost for prep and that varies from $0 to 70 + dollars - pt verbalized understanding  ?Due to the COVID-19 pandemic we are asking patients to follow certain guidelines in PV and the [place]   ?Pt aware of COVID protocols and [facility] guidelines  ?PV completed over the phone. Pt verified name, DOB, address and insurance during PV today.  ?Pt mailed instruction packet with copy of consent form to read and not return, and instructions.  ?Pt encouraged to call with questions or issues.  ?If pt has My chart, procedure instructions sent via My Chart  ? ?

## 2021-08-26 ENCOUNTER — Encounter: Payer: Medicare Other | Admitting: Internal Medicine

## 2021-10-11 ENCOUNTER — Encounter: Payer: Self-pay | Admitting: Internal Medicine

## 2021-10-18 ENCOUNTER — Encounter: Payer: Self-pay | Admitting: Internal Medicine

## 2021-10-18 ENCOUNTER — Ambulatory Visit (AMBULATORY_SURGERY_CENTER): Payer: Medicare Other | Admitting: Internal Medicine

## 2021-10-18 VITALS — BP 110/62 | HR 68 | Temp 96.2°F | Resp 13 | Ht 65.0 in | Wt 140.0 lb

## 2021-10-18 DIAGNOSIS — Z86010 Personal history of colonic polyps: Secondary | ICD-10-CM | POA: Diagnosis not present

## 2021-10-18 DIAGNOSIS — D120 Benign neoplasm of cecum: Secondary | ICD-10-CM

## 2021-10-18 DIAGNOSIS — D122 Benign neoplasm of ascending colon: Secondary | ICD-10-CM | POA: Diagnosis not present

## 2021-10-18 MED ORDER — SODIUM CHLORIDE 0.9 % IV SOLN: 500.0000 mL | Freq: Once | INTRAVENOUS | Status: DC

## 2021-10-18 NOTE — Progress Notes (Signed)
Called to room to assist during endoscopic procedure.  Patient ID and intended procedure confirmed with present staff. Received instructions for my participation in the procedure from the performing physician.  

## 2021-10-18 NOTE — Patient Instructions (Signed)
Handouts given for polyps and diverticulosis.  YOU HAD AN ENDOSCOPIC PROCEDURE TODAY AT [facility]:   Refer to the procedure report that was given to you for any specific questions about what was found during the examination.  If the procedure report does not answer your questions, please call your gastroenterologist to clarify.  If you requested that your care partner not be given the details of your procedure findings, then the procedure report has been included in a sealed envelope for you to review at your convenience later.  YOU SHOULD EXPECT: Some feelings of bloating in the abdomen. Passage of more gas than usual.  Walking can help get rid of the air that was put into your GI tract during the procedure and reduce the bloating. If you had a lower endoscopy (such as a colonoscopy or flexible sigmoidoscopy) you may notice spotting of blood in your stool or on the toilet paper. If you underwent a bowel prep for your procedure, you may not have a normal bowel movement for a few days.  Please Note:  You might notice some irritation and congestion in your nose or some drainage.  This is from the oxygen used during your procedure.  There is no need for concern and it should clear up in a day or so.  SYMPTOMS TO REPORT IMMEDIATELY:  Following lower endoscopy (colonoscopy):  Excessive amounts of blood in the stool  Significant tenderness or worsening of abdominal pains  Swelling of the abdomen that is new, acute  Fever of 100F or higher  For urgent or emergent issues, a gastroenterologist can be reached at any hour by calling [phone]. Do not use MyChart messaging for urgent concerns.    DIET:  We do recommend a small meal at first, but then you may proceed to your regular diet.  Drink plenty of fluids but you should avoid alcoholic beverages for 24 hours.  ACTIVITY:  You should plan to take it easy for the rest of today and you should NOT DRIVE or use heavy machinery  until tomorrow (because of the sedation medicines used during the test).    FOLLOW UP: Our staff will call the number listed on your records 48-72 hours following your procedure to check on you and address any questions or concerns that you may have regarding the information given to you following your procedure. If we do not reach you, we will leave a message.  We will attempt to reach you two times.  During this call, we will ask if you have developed any symptoms of COVID 19. If you develop any symptoms (ie: fever, flu-like symptoms, shortness of breath, cough etc.) before then, please call [phone].  If you test positive for Covid 19 in the 2 weeks post procedure, please call and report this information to [place].    If any biopsies were taken you will be contacted by phone or by letter within the next 1-3 weeks.  Please call us at [phone] if you have not heard about the biopsies in 3 weeks.    SIGNATURES/CONFIDENTIALITY: You and/or your care partner have signed paperwork which will be entered into your electronic medical record.  These signatures attest to the fact that that the information above on your After Visit Summary has been reviewed and is understood.  Full responsibility of the confidentiality of this discharge information lies with you and/or your care-partner.

## 2021-10-18 NOTE — Progress Notes (Signed)
Vs by DT in adm 

## 2021-10-18 NOTE — Progress Notes (Signed)
HISTORY OF PRESENT ILLNESS:  Jenny Terry is a 71 y.o. female with personal history of multiple adenomatous colon polyps.  Presents today for surveillance colonoscopy.  Previous examination 2018.  No active complaints  REVIEW OF SYSTEMS:  All non-GI ROS negative except for  Past Medical History:  Diagnosis Date   Asthma    uses inhaler PRN   Seasonal allergies     Past Surgical History:  Procedure Laterality Date   COLONOSCOPY  2018   JP-MAC-suprep (ggod)-TA's - recall 3 years   INCISION AND DRAINAGE ABSCESS Right 09/12/2015   Procedure: INCISION AND DRAINAGE ABSCESS RIGHT AXILLARY;  Surgeon: Johnathan Hausen, MD;  Location: [facility];  Service: General;  Laterality: Right;   POLYPECTOMY  2018   TA's    Social History Fernand Parkins  reports that she has never smoked. She has never used smokeless tobacco. She reports that she does not drink alcohol and does not use drugs.  family history includes COPD in her mother; Colon polyps (age of onset: 69) in her sister; Diabetes in her father; Stroke in her father.  No Known Allergies     PHYSICAL EXAMINATION:  Vital signs: BP 123/68   Pulse 69   Temp (!) 96.2 F (35.7 C)   Resp 18   Ht _0  (1.651 m)   Wt 140 lb (63.5 kg)   SpO2 97%   BMI 23.30 kg/m  General: Well-developed, well-nourished, no acute distress HEENT: Sclerae are anicteric, conjunctiva pink. Oral mucosa intact Lungs: Clear Heart: Regular Abdomen: soft, nontender, nondistended, no obvious ascites, no peritoneal signs, normal bowel sounds. No organomegaly. Extremities: No edema Psychiatric: alert and oriented x3. Cooperative     ASSESSMENT:  History of multiple adenomatous colon polyps   PLAN:  Surveillance colonoscopy

## 2021-10-18 NOTE — Progress Notes (Signed)
PT taken to PACU. Monitors in place. VSS. Report given to RN. 

## 2021-10-18 NOTE — Op Note (Signed)
[place] Patient Name: Jenny Terry Procedure Date: 10/18/2021 8:01 AM MRN: [id number] Endoscopist: Docia Chuck. Henrene Pastor , MD Age: 71 Referring MD:  Date of Birth: Dec 19, 1950 Gender: Female Account #: [id number] Procedure:                Colonoscopy with cold snare polypectomy x 2 Indications:              High risk colon cancer surveillance: Personal                            history of multiple (3 or more) adenomas. Previous                            examinations 2010, 2018 Medicines:                Monitored Anesthesia Care Procedure:                Pre-Anesthesia Assessment:                           - Prior to the procedure, a History and Physical                            was performed, and patient medications and                            allergies were reviewed. The patient's tolerance of                            previous anesthesia was also reviewed. The risks                            and benefits of the procedure and the sedation                            options and risks were discussed with the patient.                            All questions were answered, and informed consent                            was obtained. Prior Anticoagulants: The patient has                            taken no previous anticoagulant or antiplatelet                            agents. ASA Grade Assessment: II - A patient with                            mild systemic disease. After reviewing the risks                            and benefits, the patient was deemed in  satisfactory condition to undergo the procedure.                           After obtaining informed consent, the colonoscope                            was passed under direct vision. Throughout the                            procedure, the patient's blood pressure, pulse, and                            oxygen saturations were monitored continuously. The                            Olympus  CF-HQ190L [id number]) Colonoscope was                            introduced through the anus and advanced to the the                            cecum, identified by appendiceal orifice and                            ileocecal valve. The ileocecal valve, appendiceal                            orifice, and rectum were photographed. The quality                            of the bowel preparation was excellent. The                            colonoscopy was performed without difficulty. The                            patient tolerated the procedure well. The bowel                            preparation used was SUPREP via split dose                            instruction. Scope In: 8:18:33 AM Scope Out: 8:35:05 AM Scope Withdrawal Time: 0 hours 13 minutes 14 seconds  Total Procedure Duration: 0 hours 16 minutes 32 seconds  Findings:                 Two polyps were found in the ascending colon and                            cecum. The polyps were 1 to 6 mm in size. These                            polyps were removed with a cold snare. Resection  and retrieval were complete.                           Diverticula were found in the sigmoid colon.                           The exam was otherwise without abnormality on                            direct and retroflexion views. Complications:            No immediate complications. Estimated blood loss:                            None. Estimated Blood Loss:     Estimated blood loss: none. Impression:               - Two 1 to 6 mm polyps in the ascending colon and                            in the cecum, removed with a cold snare. Resected                            and retrieved.                           - Diverticulosis in the sigmoid colon.                           - The examination was otherwise normal on direct                            and retroflexion views. Recommendation:           - Repeat colonoscopy in 5 years  for surveillance.                           - Patient has a contact number available for                            emergencies. The signs and symptoms of potential                            delayed complications were discussed with the                            patient. Return to normal activities tomorrow.                            Written discharge instructions were provided to the                            patient.                           - Resume previous diet.                           -  Continue present medications.                           - Await pathology results. Docia Chuck. Henrene Pastor, MD 10/18/2021 8:47:12 AM This report has been signed electronically.

## 2021-10-21 ENCOUNTER — Telehealth: Payer: Self-pay

## 2021-10-21 NOTE — Telephone Encounter (Signed)
  Follow up Call-     10/18/2021    7:15 AM  Call back number  Post procedure Call Back phone  # [phone]  Permission to leave phone message Yes     Patient questions:  Do you have a fever, pain , or abdominal swelling? No. Pain Score  0 *  Have you tolerated food without any problems? Yes.    Have you been able to return to your normal activities? Yes.    Do you have any questions about your discharge instructions: Diet   No. Medications  No. Follow up visit  No.  Do you have questions or concerns about your Care? No.  Actions: * If pain score is 4 or above: No action needed, pain <4.

## 2021-10-22 ENCOUNTER — Encounter: Payer: Self-pay | Admitting: Internal Medicine
